# Patient Record
Sex: Male | Born: 1975 | Hispanic: Yes | Marital: Married | Smoking: Never smoker
Health system: Southern US, Community
[De-identification: ages and names within clinical notes are randomized; demographics above are authoritative.]

## PROBLEM LIST (undated history)

## (undated) DIAGNOSIS — M5416 Radiculopathy, lumbar region: Secondary | ICD-10-CM

## (undated) HISTORY — DX: Radiculopathy, lumbar region: M54.16

## (undated) HISTORY — PX: OTHER SURGICAL HISTORY: SHX169

---

## 2016-05-10 ENCOUNTER — Emergency Department (HOSPITAL_COMMUNITY)
Admission: EM | Admit: 2016-05-10 | Discharge: 2016-05-10 | Disposition: A | Discharge status: Home / Self Care | Payer: Self-pay | Attending: Emergency Medicine | Admitting: Emergency Medicine

## 2016-05-10 ENCOUNTER — Encounter (HOSPITAL_COMMUNITY): Payer: Self-pay | Admitting: Emergency Medicine

## 2016-05-10 ENCOUNTER — Emergency Department (HOSPITAL_COMMUNITY): Payer: Self-pay

## 2016-05-10 DIAGNOSIS — Y939 Activity, unspecified: Secondary | ICD-10-CM | POA: Insufficient documentation

## 2016-05-10 DIAGNOSIS — X58XXXA Exposure to other specified factors, initial encounter: Secondary | ICD-10-CM | POA: Insufficient documentation

## 2016-05-10 DIAGNOSIS — F17200 Nicotine dependence, unspecified, uncomplicated: Secondary | ICD-10-CM | POA: Insufficient documentation

## 2016-05-10 DIAGNOSIS — S39012A Strain of muscle, fascia and tendon of lower back, initial encounter: Secondary | ICD-10-CM | POA: Insufficient documentation

## 2016-05-10 DIAGNOSIS — Y929 Unspecified place or not applicable: Secondary | ICD-10-CM | POA: Insufficient documentation

## 2016-05-10 DIAGNOSIS — Y999 Unspecified external cause status: Secondary | ICD-10-CM | POA: Insufficient documentation

## 2016-05-10 MED ORDER — CYCLOBENZAPRINE HCL 10 MG PO TABS
10.0000 mg | ORAL_TABLET | Freq: Three times a day (TID) | ORAL | 0 refills | Status: DC | PRN
Start: 2016-05-10 — End: 2016-06-24

## 2016-05-10 MED ORDER — HYDROCODONE-ACETAMINOPHEN 5-325 MG PO TABS
1.0000 | ORAL_TABLET | Freq: Once | ORAL | Status: AC
  Administered 2016-05-10: 1 via ORAL
  Filled 2016-05-10: qty 1

## 2016-05-10 MED ORDER — NAPROXEN 500 MG PO TABS: 500.0000 mg | ORAL_TABLET | Freq: Two times a day (BID) | ORAL | 0 refills | Status: DC

## 2016-05-10 MED ORDER — METHYLPREDNISOLONE 4 MG PO TBPK: ORAL_TABLET | ORAL | 0 refills | Status: DC

## 2016-05-10 NOTE — ED Notes (Signed)
Patient transported to X-ray 

## 2016-05-10 NOTE — ED Provider Notes (Signed)
[facility] Provider Note   CSN: [id number] Arrival date & time: 05/10/16  0840  By signing my name below, I, Jason Hunt, attest that this documentation has been prepared under the direction and in the presence of Jason Loll, PA-C. Electronically Signed: Majel Hunt, Scribe. 05/10/2016. 9:46 AM.  History   Chief Complaint Chief Complaint  Patient presents with  . Back Pain   The history is provided by the patient. No language interpreter was used.   HPI Comments: Jason Hunt is a 41 y.o. male who presents to the Emergency Department complaining of gradually worsening, back pain that began 4 days ago and worsened this morning. Pt reports his pain is exacerbated when moving from a seated to standing position and when ambulating. However, he states he experiences "no pain" when lying in a supine position and sitting upright in a chair. He states hx of similar back pain ~4 years ago that resolved on its own after a few days. Pt notes he has applied IcyHot to his back with no relief. He denies difficulty urinating, urinary or bowel incontinence, fever, hx of cancer or IV drug use and saddle anaesthesia.   History reviewed. No pertinent past medical history.  There are no active problems to display for this patient.  History reviewed. No pertinent surgical history.  Home Medications    Prior to Admission medications   Not on File    Family History No family history on file.  Social History Social History  Substance Use Topics  . Smoking status: Current Some Day Smoker  . Smokeless tobacco: Not on file  . Alcohol use Yes     Allergies   Patient has no known allergies.   Review of Systems Review of Systems  Constitutional: Negative for fever.  Genitourinary: Negative for difficulty urinating.  Musculoskeletal: Positive for back pain.  Neurological: Negative for numbness.  All other systems reviewed and are negative.  Physical Exam Updated Vital  Signs BP 123/78 (BP Location: Right Arm)   Pulse 75   Temp 98.4 F (36.9 C)   Resp 17   Ht 5\' 5"  (1.651 m)   Wt 155 lb (70.3 kg)   SpO2 98%   BMI 25.79 kg/m   Physical Exam  Constitutional: He is oriented to person, place, and time. He appears well-developed and well-nourished.  HENT:  Head: Normocephalic.  Eyes: EOM are normal.  Neck: Normal range of motion. Neck supple.  No midline C-spine tenderness.  Cardiovascular: Intact distal pulses.   Pulmonary/Chest: Effort normal.  Abdominal: He exhibits no distension.  Musculoskeletal: Normal range of motion. He exhibits tenderness. He exhibits no deformity.  No T-spine midline tenderness. Tenderness over the midline L-spine. Mild bilateral paraspinal tenderness to the L-spine. Full range of motion. No deformities or step-offs noted. Negative straight leg raise test left and right. Strength 5 out of 5 in lower extremities. Reflexes 2+ at the patella. Cap refill normal. DP pulses are 2+ bilaterally. Patient is able to cannulate with normal gait.  Neurological: He is alert and oriented to person, place, and time. GCS eye subscore is 4. GCS verbal subscore is 5. GCS motor subscore is 6.  Reflex Scores:      Patellar reflexes are 2+ on the right side and 2+ on the left side. Skin: Skin is warm and dry. Capillary refill takes less than 2 seconds.  Psychiatric: He has a normal mood and affect.  Nursing note and vitals reviewed.  ED Treatments / Results  Labs (all  labs ordered are listed, but only abnormal results are displayed) Labs Reviewed - No data to display  EKG  EKG Interpretation None       Radiology Dg Lumbar Spine Complete  Result Date: 05/10/2016 CLINICAL DATA:  Worsening back pain which began 4 days ago. EXAM: LUMBAR SPINE - COMPLETE 4+ VIEW COMPARISON:  None. FINDINGS: There are 5 non rib-bearing lumbar type vertebrae. There is partial lumbarization of S1 with a rudimentary S1-2 disc. Vertebral alignment is normal.  Vertebral body heights are preserved without evidence of fracture. No pars defects are identified. Disc space heights are preserved without significant degenerative changes identified. No soft tissue abnormality is seen. IMPRESSION: Negative. Electronically Signed   By: Sebastian AcheAllen  Grady M.D.   On: 05/10/2016 11:03    Procedures Procedures (including critical care time)  Medications Ordered in ED Medications  HYDROcodone-acetaminophen (NORCO/VICODIN) 5-325 MG per tablet 1 tablet (1 tablet Oral Given 05/10/16 0959)    DIAGNOSTIC STUDIES:  Oxygen Saturation is 98% on RA, normal by my interpretation.    COORDINATION OF CARE:  9:46 AM Discussed treatment plan with pt at bedside and pt agreed to plan.  Initial Impression / Assessment and Plan / ED Course  I have reviewed the triage vital signs and the nursing notes.  Pertinent labs & imaging results that were available during my care of the patient were reviewed by me and considered in my medical decision making (see chart for details).  Clinical Course   Patient with back pain. Patient works in [employer] and this is likely a musculoskeletal lower back strain. No neurological deficits and normal neuro exam.  Patient is ambulatory with normal gait. Lumbar x-ray is unremarkable for any acute findings. No loss of bowel or bladder control.  No concern for cauda equina.  No fever, night sweats, weight loss, h/o cancer, IVDA, no recent procedure to back. No urinary symptoms suggestive of UTI.  Patient given prescription for muscle relaxers, naproxen, steroid Dosepak. Encourage back exercises and back support with working. Encouraged heat to lower back. Supportive care and return precaution discussed. Encouraged follow-up with primary care doctor if symptoms do not improve. Strict return precautions discussed including loss of bowel or bladder, urinary retention, saddle paresthesias, fever or for any other reason. Appears safe for discharge at this time.  Follow up as indicated in discharge paperwork.   I personally performed the services described in this documentation, which was scribed in my presence. The recorded information has been reviewed and is accurate.   Final Clinical Impressions(s) / ED Diagnoses   Final diagnoses:  Strain of lumbar region, initial encounter    New Prescriptions New Prescriptions   CYCLOBENZAPRINE (FLEXERIL) 10 MG TABLET    Take 1 tablet (10 mg total) by mouth 3 (three) times daily as needed for muscle spasms.   METHYLPREDNISOLONE (MEDROL DOSEPAK) 4 MG TBPK TABLET    Take as directed   NAPROXEN (NAPROSYN) 500 MG TABLET    Take 1 tablet (500 mg total) by mouth 2 (two) times daily.     Rise MuKenneth T Leaphart, PA-C 05/10/16 1123    Lavera Guiseana Duo Liu, MD 05/10/16 [phone]

## 2016-05-10 NOTE — ED Notes (Signed)
Lower back  X 4 days , works [employer] hurts  To walk,

## 2016-05-10 NOTE — ED Triage Notes (Signed)
Pt states "i've had pain in my back for a week," pt pointing to center of back. Pt states when he stands up it hurts worse. Pt works [employer].

## 2016-05-10 NOTE — Discharge Instructions (Signed)
Your x-ray does not show any fractures of your bone. This is likely a musculoskeletal sprain. You need to wear a back brace while working. I have also given you back exercises to use. Take the Flexeril which is a muscle relaxer. This will make you drowsy so do not drive with that or operate heavy machinery. Take the Medrol Dosepak as prescribed by the pharmacist. Take the naproxen 2 times a day for no more than 5 days. He may also take Tylenol. Avoid any other NSAIDs including Motrin or Advil. Follow up with your primary care doctor. Return to the ED if you develop any worsening symptoms including losing control of her bowel or bladder, or unable to urinate, numbness in her groin, numbness in her legs.

## 2016-05-10 NOTE — ED Notes (Signed)
Pt returned from xray

## 2016-05-15 ENCOUNTER — Emergency Department (HOSPITAL_COMMUNITY)
Admission: EM | Admit: 2016-05-15 | Discharge: 2016-05-15 | Disposition: A | Discharge status: Home / Self Care | Payer: Self-pay | Attending: Emergency Medicine | Admitting: Emergency Medicine

## 2016-05-15 ENCOUNTER — Encounter (HOSPITAL_COMMUNITY): Payer: Self-pay | Admitting: Emergency Medicine

## 2016-05-15 DIAGNOSIS — Z79899 Other long term (current) drug therapy: Secondary | ICD-10-CM | POA: Insufficient documentation

## 2016-05-15 DIAGNOSIS — M545 Low back pain, unspecified: Secondary | ICD-10-CM

## 2016-05-15 DIAGNOSIS — F17200 Nicotine dependence, unspecified, uncomplicated: Secondary | ICD-10-CM | POA: Insufficient documentation

## 2016-05-15 MED ORDER — TRAMADOL HCL 50 MG PO TABS: 50.0000 mg | ORAL_TABLET | Freq: Four times a day (QID) | ORAL | 0 refills | Status: DC | PRN

## 2016-05-15 NOTE — ED Notes (Signed)
Pt stable, understands discharge instructions, and reasons for return.   

## 2016-05-15 NOTE — ED Provider Notes (Signed)
[facility] Provider Note   CSN: [id number] Arrival date & time: 05/15/16  1857   History   Chief Complaint Chief Complaint  Patient presents with  . Back Pain    HPI Denyce RobertRene Chavez Dubon is a 41 y.o. male.  HPI   41 year old male presents today with complaints of back pain. Patient reports that he was seen on 05/10/2016 for similar presentation. He notes at that time he was having bilateral lower back pain with no radiation of symptoms, no complications. He was given muscle relaxers, steroids, and naproxen. Patient notes that symptoms improved slightly, but again worsened when he tried to go back to work. Patient notes the pain is bilateral lower lumbar region, with no radiation of symptoms. He denies any loss of distal sensation strength or motor function. He denies any abdominal pain. Patient denies any other red flags for back pain. Patient reports that while laying down he feels very little pain, pain is worsened by standing up moving and bending forward. No history trauma, but patient notes he works in [employer].  History reviewed. No pertinent past medical history.  There are no active problems to display for this patient.   History reviewed. No pertinent surgical history.     Home Medications    Prior to Admission medications   Medication Sig Start Date End Date Taking? Authorizing Provider  cyclobenzaprine (FLEXERIL) 10 MG tablet Take 1 tablet (10 mg total) by mouth 3 (three) times daily as needed for muscle spasms. 05/10/16   Rise MuKenneth T Leaphart, PA-C  methylPREDNISolone (MEDROL DOSEPAK) 4 MG TBPK tablet Take as directed 05/10/16   Rise MuKenneth T Leaphart, PA-C  naproxen (NAPROSYN) 500 MG tablet Take 1 tablet (500 mg total) by mouth 2 (two) times daily. 05/10/16   Rise MuKenneth T Leaphart, PA-C  traMADol (ULTRAM) 50 MG tablet Take 1 tablet (50 mg total) by mouth every 6 (six) hours as needed. 05/15/16   Eyvonne MechanicJeffrey Kacee Koren, PA-C    Family History No family history on  file.  Social History Social History  Substance Use Topics  . Smoking status: Current Some Day Smoker  . Smokeless tobacco: Never Used  . Alcohol use Yes     Allergies   Patient has no known allergies.   Review of Systems Review of Systems  All other systems reviewed and are negative.    Physical Exam Updated Vital Signs BP 145/79 (BP Location: Left Arm)   Pulse 72   Temp 98.3 F (36.8 C) (Oral)   Resp 20   SpO2 97%     Physical Exam  Constitutional: He is oriented to person, place, and time. He appears well-developed and well-nourished. No distress.  HENT:  Head: Normocephalic and atraumatic.  Eyes: Conjunctivae are normal. Pupils are equal, round, and reactive to light. Right eye exhibits no discharge. Left eye exhibits no discharge. No scleral icterus.  Neck: Normal range of motion. Neck supple. No JVD present. No tracheal deviation present.  Pulmonary/Chest: Effort normal. No stridor.  Musculoskeletal: Normal range of motion. He exhibits no edema or tenderness.  No C, T, or L spine tenderness to palpation. No obvious signs of trauma, deformity, infection, step-offs. Lung expansion normal. No scoliosis or kyphosis. Bilateral lower extremity strength 5 out of 5, sensation grossly intact Very minor tenderness to palpation of the bilateral lower lumbar soft tissue.  Straight leg negative Ambulates without difficulty   Neurological: He is alert and oriented to person, place, and time. Coordination normal.  Skin: Skin is warm and dry. He  is not diaphoretic.  Psychiatric: He has a normal mood and affect. His behavior is normal. Judgment and thought content normal.  Nursing note and vitals reviewed.    ED Treatments / Results  Labs (all labs ordered are listed, but only abnormal results are displayed) Labs Reviewed - No data to display  EKG  EKG Interpretation None       Radiology No results found.  Procedures Procedures (including critical care  time)  Medications Ordered in ED Medications - No data to display   Initial Impression / Assessment and Plan / ED Course  I have reviewed the triage vital signs and the nursing notes.  Pertinent labs & imaging results that were available during my care of the patient were reviewed by me and considered in my medical decision making (see chart for details).  Clinical Course      Final Clinical Impressions(s) / ED Diagnoses   Final diagnoses:  Acute low back pain without sciatica, unspecified back pain laterality    Labs:  Imaging:  Consults:  Therapeutics:  Discharge Meds: ultram  Assessment/Plan:  41 year old male presents today with a complaint of back pain. Patient has been seen in the emergency room for this before, reports improvement in symptoms but had a relapse after trying to collect work. Patient will be given a short course of pain medication, encouraged to rest, back exercises, follow-up with orthopedic specialist. Patient given strict return caution's, he verbalized understanding and agreement to today's plan had no further questions or concerns.  Patient's son assisted in translation at patient's request.   New Prescriptions Discharge Medication List as of 05/15/2016  9:08 PM    START taking these medications   Details  traMADol (ULTRAM) 50 MG tablet Take 1 tablet (50 mg total) by mouth every 6 (six) hours as needed., Starting Sun 05/15/2016, Print         Eyvonne Mechanic, PA-C 05/15/16 8469    Arby Barrette, MD 05/17/16 6295

## 2016-05-15 NOTE — ED Triage Notes (Signed)
Pt returns for c/o continues to have back pain. Pt seen here on the 9th for  Same. Pt denies injury.

## 2016-06-24 ENCOUNTER — Emergency Department (HOSPITAL_COMMUNITY)
Admission: EM | Admit: 2016-06-24 | Discharge: 2016-06-24 | Disposition: A | Discharge status: Home / Self Care | Payer: Self-pay | Attending: Emergency Medicine | Admitting: Emergency Medicine

## 2016-06-24 ENCOUNTER — Encounter (HOSPITAL_COMMUNITY): Payer: Self-pay

## 2016-06-24 DIAGNOSIS — M5126 Other intervertebral disc displacement, lumbar region: Secondary | ICD-10-CM

## 2016-06-24 DIAGNOSIS — Z79899 Other long term (current) drug therapy: Secondary | ICD-10-CM | POA: Insufficient documentation

## 2016-06-24 DIAGNOSIS — K458 Other specified abdominal hernia without obstruction or gangrene: Secondary | ICD-10-CM | POA: Insufficient documentation

## 2016-06-24 MED ORDER — PREDNISONE 10 MG (21) PO TBPK: ORAL_TABLET | Freq: Every day | ORAL | 0 refills | Status: DC

## 2016-06-24 NOTE — Discharge Instructions (Signed)
Take steroid pack along with your other medicines Call Monday for appointment with Dr. Penni BombardKendall this week

## 2016-06-24 NOTE — ED Notes (Signed)
Pt states he was sent here to see if there was a neurosurgeon to perform surgery on his lumbar hernia. [facility] and had a MRI. Pt states he came here because insurance is refusing to pay.

## 2016-06-24 NOTE — ED Notes (Signed)
Pt able to ambulate without difficulty.

## 2016-06-24 NOTE — ED Triage Notes (Addendum)
Per Pt through translator, Pt is coming from home with complaints of back pain. Pt was referred to Orthopedist. The patient was known to have a slipped disk. Pt does not have insurance and was not able to pay. The pt reports that his work is not willing to pay even though it is a work related problem. He is currently battling with insurance. Pt was sent here because it would be cheaper to have surgery, and it could be done by a neuro surgeon. Pt has had a MRI and stated he could get the results per request. Medication was given and pt has been able to manage the pain, but is requesting surgery for long term. Pt denies any change in urination or bowel movements. Denies any numbness or tingling in legs. Orthopedist stated without surgery the pain would continue into his legs.

## 2016-06-24 NOTE — ED Provider Notes (Signed)
[facility] Provider Note   CSN: [id number] Arrival date & time: 06/24/16  [id number]   By signing my name below, I, Clovis PuAvnee Patel, attest that this documentation has been prepared under the direction and in the presence of  Terance HartKelly Gaberiel Youngblood, PA-C. Electronically Signed: Clovis PuAvnee Patel, ED Scribe. 06/24/16. 5:55 PM.   History   Chief Complaint Chief Complaint  Patient presents with  . Back Pain   The history is provided by the patient. A language interpreter was used.   HPI Comments:  Jason Hunt is a 41 y.o. male who presents to the Emergency Department complaining of persistent back pain which radiates to his sides and bilateral legs onset several weeks ago. Pt states he was seen by his orthopedist, [facility], had an MRI of his back, was diagnosed with a "lumbar hernia". He states he was told by a receptionist today that he needed to visit the ED to see a neurosurgeon and have surgery. Pt states his place of work is not willing to pay for his medical bills and notes he does not have insurance so he thinks this is why he was told that.  Per chart review, pt has been seen twice in the ED for similar presentation. On 05/10/16 pt was diagnosed with a strain of the lumbar region and was prescribed flexeril and naproxen. On 05/15/16 pt was prescribed tramadol for his back pain. Pt notes these medications are no longer providing relief. He denies any other associated symptoms at this time. No other complaints noted. No leg weakness, bowel/bladder incontinence.  Orthopedist: Dr. Rodolph BongAdam Kendall    History reviewed. No pertinent past medical history.  There are no active problems to display for this patient.   History reviewed. No pertinent surgical history.   Home Medications    Prior to Admission medications   Medication Sig Start Date End Date Taking? Authorizing Provider  cyclobenzaprine (FLEXERIL) 10 MG tablet Take 1 tablet (10 mg total) by mouth 3 (three)  times daily as needed for muscle spasms. 05/10/16   Rise MuKenneth T Leaphart, PA-C  methylPREDNISolone (MEDROL DOSEPAK) 4 MG TBPK tablet Take as directed 05/10/16   Rise MuKenneth T Leaphart, PA-C  naproxen (NAPROSYN) 500 MG tablet Take 1 tablet (500 mg total) by mouth 2 (two) times daily. 05/10/16   Rise MuKenneth T Leaphart, PA-C  traMADol (ULTRAM) 50 MG tablet Take 1 tablet (50 mg total) by mouth every 6 (six) hours as needed. 05/15/16   Eyvonne MechanicJeffrey Hedges, PA-C    Family History No family history on file.  Social History Social History  Substance Use Topics  . Smoking status: Never Smoker  . Smokeless tobacco: Never Used  . Alcohol use Yes     Allergies   Patient has no known allergies.   Review of Systems Review of Systems  Musculoskeletal: Positive for back pain and myalgias.  Neurological: Negative for numbness.   Physical Exam Updated Vital Signs BP 150/93 (BP Location: Left Arm)   Pulse 78   Temp 98.9 F (37.2 C) (Oral)   Resp 18   Ht 5\' 7"  (1.702 m)   Wt 170 lb (77.1 kg)   SpO2 98%   BMI 26.63 kg/m   Physical Exam  Constitutional: He is oriented to person, place, and time. He appears well-developed and well-nourished. No distress.  HENT:  Head: Normocephalic and atraumatic.  Eyes: Conjunctivae are normal.  Cardiovascular: Normal rate.   Pulmonary/Chest: Effort normal.  Abdominal: He exhibits no distension.  Musculoskeletal: He exhibits  tenderness.  Inspection: No masses, deformity, or rash Palpation: Midline lumbar spinal tenderness. No paraspinal muscle tenderness. Strength: 5/5 in lower extremities and normal plantar and dorsiflexion Sensation: Intact sensation with light touch in lower extremities bilaterally Reflexes: Patellar reflex is 2+ bilaterally Gait: Normal gait  Neurological: He is alert and oriented to person, place, and time.  Skin: Skin is warm and dry.  Psychiatric: He has a normal mood and affect.  Nursing note and vitals reviewed.    ED Treatments / Results    DIAGNOSTIC STUDIES:  Oxygen Saturation is 98% on RA, normal by my interpretation.    COORDINATION OF CARE:  5:52 PM Discussed treatment plan with pt at bedside and pt agreed to plan.  Labs (all labs ordered are listed, but only abnormal results are displayed) Labs Reviewed - No data to display  EKG  EKG Interpretation None       Radiology No results found.  Procedures Procedures (including critical care time)  Medications Ordered in ED Medications - No data to display   Initial Impression / Assessment and Plan / ED Course  I have reviewed the triage vital signs and the nursing notes.  Pertinent labs & imaging results that were available during my care of the patient were reviewed by me and considered in my medical decision making (see chart for details).  41 year old male presents with low back pain. He has good strength in his legs and is ambulatory. No bowel/bladder incontinence. Has known herniated disc. Unsure of why he was told to come have surgery here but likely the language barrier has caused some confusion. Spoke with Dr. Charlann Boxer [facility]. He states to start patient on Prednisone dose pack and have patient call the office Monday for follow up with Penni Bombard this week. Discussed with patient at length about plan. He understands he needs to take steroids and follow up with Dr. Markus Jarvis office for another appointment.   Final Clinical Impressions(s) / ED Diagnoses   Final diagnoses:  Lumbar herniated disc    New Prescriptions Discharge Medication List as of 06/24/2016  7:54 PM    START taking these medications   Details  predniSONE (STERAPRED UNI-PAK 21 TAB) 10 MG (21) TBPK tablet Take by mouth daily. Take 6 tabs by mouth daily  for 2 days, then 5 tabs for 2 days, then 4 tabs for 2 days, then 3 tabs for 2 days, 2 tabs for 2 days, then 1 tab by mouth daily for 2 days, Starting Fri 06/24/2016, Print       I personally performed the services  described in this documentation, which was scribed in my presence. The recorded information has been reviewed and is accurate.     Bethel Born, PA-C 06/24/16 2039    Alvira Monday, MD 06/25/16 1236

## 2016-07-26 ENCOUNTER — Ambulatory Visit: Payer: Self-pay | Admitting: Physical Therapy

## 2016-08-24 ENCOUNTER — Ambulatory Visit (INDEPENDENT_AMBULATORY_CARE_PROVIDER_SITE_OTHER): Payer: Self-pay | Admitting: Physician Assistant

## 2016-08-26 ENCOUNTER — Ambulatory Visit: Payer: Self-pay | Attending: Family Medicine | Admitting: Family Medicine

## 2016-08-26 ENCOUNTER — Encounter: Payer: Self-pay | Admitting: Family Medicine

## 2016-08-26 VITALS — BP 129/79 | HR 94 | Temp 98.3°F | Resp 18 | Ht 67.0 in | Wt 169.2 lb

## 2016-08-26 DIAGNOSIS — M5126 Other intervertebral disc displacement, lumbar region: Secondary | ICD-10-CM | POA: Insufficient documentation

## 2016-08-26 DIAGNOSIS — G8929 Other chronic pain: Secondary | ICD-10-CM | POA: Insufficient documentation

## 2016-08-26 DIAGNOSIS — Z79899 Other long term (current) drug therapy: Secondary | ICD-10-CM | POA: Insufficient documentation

## 2016-08-26 MED ORDER — GABAPENTIN 300 MG PO CAPS: 300.0000 mg | ORAL_CAPSULE | Freq: Every evening | ORAL | 0 refills | Status: DC | PRN

## 2016-08-26 MED ORDER — DICLOFENAC SODIUM 75 MG PO TBEC: 75.0000 mg | DELAYED_RELEASE_TABLET | Freq: Two times a day (BID) | ORAL | 0 refills | Status: DC

## 2016-08-26 MED ORDER — METHOCARBAMOL 500 MG PO TABS: 500.0000 mg | ORAL_TABLET | Freq: Three times a day (TID) | ORAL | 0 refills | Status: DC | PRN

## 2016-08-26 NOTE — Patient Instructions (Signed)
Dolor de espalda en adultos (Back Pain, Adult) El dolor de espalda es muy frecuente en los adultos.La causa del dolor de espalda es rara vez peligrosa y Conservation officer, historic buildings a menudo mejora con el [place].Es posible que se desconozca la causa de esta afeccin. Algunas causas comunes son las siguientes:  Distensin de los msculos o ligamentos que sostienen la columna vertebral.  Holiday representative (degeneracin) de los discos vertebrales.  Artritis.  Lesiones directas en la espalda. En [employer], el dolor de espalda es recurrente. Como rara vez es peligroso, las personas pueden aprender a Holiday representative afeccin por s mismas. INSTRUCCIONES PARA EL CUIDADO EN EL HOGAR Controle su dolor de espalda a fin de Recruitment consultant cambio. Las siguientes indicaciones ayudarn a Chief Strategy Officer que pueda sentir:  IT consultant. Si permanece sentado o de pie en un mismo lugar durante mucho tiempo, se tensiona la espalda. No se siente, conduzca o permanezca de pie en un mismo lugar durante ms de 30 minutos seguidos. Realice caminatas cortas en superficies planas tan pronto como le sea posible.Trate de caminar un poco ms de Publishing copy.  Haga ejercicio regularmente como se lo haya indicado el mdico. El ejercicio ayuda a que su espalda se cure ms rpidamente. Tambin ayuda a prevenir futuras lesiones al [employer] fuertes y flexibles.  No permanezca en la cama.Si hace reposo ms de 1 a 2 das, puede demorar su recuperacin.  Preste atencin a su cuerpo al inclinarse y levantarse. Las posiciones ms cmodas son las que ejercen menos tensin en la espalda en recuperacin. Siempre use tcnicas apropiadas para levantar objetos, como por ejemplo:  Flexionar las rodillas.  Mantener la carga cerca del cuerpo.  No torcerse.  Encuentre una posicin cmoda para dormir. Use un colchn firme y recustese de costado con las rodillas ligeramente flexionadas. Si se recuesta [employer],  coloque una almohada debajo de las rodillas.  Evite sentir ansiedad o estrs.El estrs aumenta la tensin muscular y puede empeorar el dolor de espalda.Es importante reconocer si se siente ansioso o estresado y aprender maneras de controlarlo, por ejemplo haciendo ejercicio.  Tome los medicamentos solamente como se lo haya indicado el mdico. Los medicamentos de venta libre para Best boy y la inflamacin a menudo son los ms eficaces.El mdico puede recetarle relajantes musculares.Estos medicamentos ayudan a Glass blower/designer de modo que pueda reanudar ms rpidamente sus actividades normales y el ejercicio saludable.  Aplique hielo sobre la zona lesionada.  Ponga el hielo en una bolsa plstica.  Coloque una toalla entre la piel y la bolsa de hielo.  Deje el hielo durante 96mnutos, 2 a 3veces por da, durante los primeros 2 o 3das. Despus de eso, puede alternar el hielo y el calor para reducir eConservation officer, historic buildingsy los espasmos.  Mantenga un peso saludable. El exceso de peso ejerce presin adicional sobre la espalda y hace que resulte difcil mantener una buena pTwain SOLICITE ATENCIN MDICA SI:  Siente un dolor que no se alivia con reposo o medicamentos.  Siente mucho dolor que se extiende a las piernas o los glteos.  El dolor no mejora en una semana.  Siente dolor por la noche.  Pierde peso.  Siente escalofros o fiebre. SOLICITE ATENCIN MDICA DE INMEDIATO SI:  Tiene nuevos problemas para controlar la vejiga o los intestinos.  Siente debilidad o adormecimiento inusuales en los brazos o en las piernas.  Siente nuseas o vmitos.  Siente dolor abdominal.  Siente que va a desmayarse.  Esta informacin no tiene Marine scientist el consejo del mdico. Asegrese de hacerle al mdico cualquier pregunta que tenga. Document Released: 04/18/2005 Document Revised: 05/09/2014 Document Reviewed: 08/20/2013 Elsevier Interactive Patient Education  2017 [place] tablets Qu es este medicamento? El METOCARBAMOL ayuda a Best boy y la rigidez muscular causado por esfuerzos, esguinces y Scientist, research (medical) lesiones de los msculos. Este medicamento puede ser utilizado para otros usos; si tiene alguna pregunta consulte con su proveedor de atencin mdica o con su farmacutico. MARCAS COMUNES: Robaxin Qu le debo informar a mi profesional de la salud antes de tomar este medicamento? Necesita saber si usted presenta alguno de los siguientes problemas o situaciones: -enfermedad renal -convulsiones -una reaccin alrgica o inusual al metocarbamol, a otros medicamentos, alimentos, colorantes o conservantes -si est embarazada o buscando quedar embarazada -si est amamantando a un beb Cmo debo utilizar este medicamento? Tome este medicamento por va oral con un vaso lleno de agua. Siga las instrucciones de la etiqueta del [place]. Tome sus dosis a intervalos regulares. No tome su medicamento con una frecuencia mayor a la indicada. Hable con su pediatra para informarse acerca del uso de este medicamento en nios. Puede requerir atencin especial. Sobredosis: Pngase en contacto inmediatamente con un centro toxicolgico o una sala de urgencia si usted cree que haya tomado demasiado medicamento. ATENCIN: [employer] es solo para usted. No comparta este medicamento con nadie. Qu sucede si me olvido de una dosis? Si olvida una dosis, tmela lo antes posible. Si es casi la hora de la prxima dosis, tome slo esa dosis. No tome dosis adicionales o dobles. Qu puede interactuar con este medicamento? No tome esta medicina con ninguno de los siguientes medicamentos: medicamentos narcticos para la tos Esta medicina tambin puede interactuar con los siguientes medicamentos: alcohol antihistamnicos para Buyer, retail, tos y resfro ciertos medicamentos para la ansiedad o para conciliar el sueo ciertos medicamentos para la depresin, como  amitriptilina, fluoxetina, sertralina ciertos medicamentos para convulsiones, tales como fenobarbital, primidona inhibidores de colinesterasa, tales como neostigmina, ambenonium y bromuro de piridostigmina anestsicos generales, tales como halotano, isoflurano, metoxiflurano, propofol anestsicos locales, tales como lidocana, pramoxina, tetracana medicamentos para relajar los msculos antes de una ciruga medicamentos narcticos para Conservation officer, historic buildings fenotiazinas, tales como clorpromazina, Musician, Government social research officer, tioridazina Puede ser que esta lista no menciona todas las posibles interacciones. Informe a su profesional de [employer]	[place] de [employer] productos a base de hierbas, medicamentos de [place] o suplementos nutritivos que est tomando. Si usted fuma, consume bebidas alcohlicas o si utiliza drogas ilegales, indqueselo tambin a su profesional de KB Home	[place]. Algunas sustancias pueden interactuar con su medicamento. A qu debo estar atento al usar Coca[employer]a? Informe a su mdico o a su profesional de la salud si sus sntomas no comienzan a mejorar o si empeoran. Puede experimentar somnolencia o mareos. No conduzca, no utilice maquinaria ni haga nada que Associate Professor en estado de alerta hasta que sepa cmo le afecta este medicamento. No se siente ni se ponga de pie con rapidez, especialmente si es un paciente de edad avanzada. Esto reduce el riesgo de mareos o [employer]. El alcohol puede interferir con el efecto de este medicamento. Evite consumir bebidas alcohlicas. Si est tomando otro medicamento que tambin causa somnolencia, es posible que tenga ms efectos secundarios. Entrguele a su proveedor de atencin mdica una lista de todos los medicamentos que [place]. Su mdico le dir cunto Dentist. No tome ms medicamento que lo indicado. Llame al servicio de  emergencias para recibir ayuda si tiene problemas para respirar o somnolencia inusual. Qu efectos secundarios puedo tener al  [employer] este medicamento? Efectos secundarios que debe informar a su mdico o a Barrister's clerk de la salud tan pronto como sea posible: Chief of Staff, como erupcin cutnea, comezn/picazn o urticarias, hinchazn de la cara, los labios o la lengua problemas respiratorios confusin convulsiones cansancio o debilidad inusual Efectos secundarios que generalmente no requieren atencin mdica (debe informarlos a su mdico o a Barrister's clerk de la salud si persisten o si son molestos): Teacher, English as a foreign language de cabeza sabor metlico cansancio Higher education careers adviser Puede ser que esta lista no menciona todos los posibles efectos secundarios. Comunquese a su mdico por asesoramiento mdico [employer]. Usted puede informar los efectos secundarios a la FDA por telfono al 1-800-FDA-1088. Dnde debo guardar mi medicina? Mantngala fuera del alcance de los nios. Gurdela a una temperatura, entre 20 y 1 grados C (18 y 52 grados F). Mantenga el envase bien cerrado. Deseche los medicamentos que no haya utilizado, despus de la fecha de vencimiento. ATENCIN: Este folleto es un resumen. Puede ser que no cubra toda la posible informacin. Si usted tiene preguntas acerca de esta medicina, consulte con su mdico, su farmacutico o su profesional de Technical sales engineer.  2018 Elsevier/Gold Standard (2015-03-17 00:00:00)

## 2016-08-26 NOTE — Progress Notes (Signed)
Patient is here for back pain  Patient stated that he been to the orthopedic of [place] & they diagnosed him Hernia in the disc   Patient stated that with the medication that they gave him it eased the pain the pain they also told him that he might be needing surgery   So patient wants to know even tough he does not have pain no more does he still has to have the surgery

## 2016-08-26 NOTE — Progress Notes (Signed)
Subjective:  Patient ID: Jason Hunt, male    DOB: 06/12/1975  Age: 41 y.o. MRN: [id number]  CC: Establish Care   HPI Jason Hunt presents for   Chronic back pain:  Patient complains of chronic low back pain. This is evaluated as a personal injury. The patient first noted symptoms 4months ago. It was related to lifting a heavy object at work. The pain is rated moderate to severe, and is located at the left lumbar area. The pain is described as aching and occurs all day. The symptoms denies been progressive. Symptoms are exacerbated by flexion. Factors which relieve the pain include muscle relaxants and NSAIDs. Other associated symptoms include tingling in the left leg and numbness. Previous history of symptoms: never. Denies any bowel or bladder incontinence. Treatment efforts have included rest, prescription NSAIDS, oral steroids and muscle relaxers, with  moderate relief of symptoms. Reports being seen followed by orthopedic specialist at [facility] who recommended surgical intervention.    Outpatient Medications Prior to Visit  Medication Sig Dispense Refill  . predniSONE (STERAPRED UNI-PAK 21 TAB) 10 MG (21) TBPK tablet Take by mouth daily. Take 6 tabs by mouth daily  for 2 days, then 5 tabs for 2 days, then 4 tabs for 2 days, then 3 tabs for 2 days, 2 tabs for 2 days, then 1 tab by mouth daily for 2 days 42 tablet 0  . diclofenac (VOLTAREN) 75 MG EC tablet Take 75 mg by mouth 2 (two) times daily.  0  . gabapentin (NEURONTIN) 300 MG capsule Take 300 mg by mouth at bedtime.     No facility-administered medications prior to visit.     ROS Review of Systems  Constitutional: Negative.   Respiratory: Negative.   Cardiovascular: Negative.   Gastrointestinal: Negative.   Musculoskeletal: Positive for back pain.  Skin: Negative.   Neurological:       Left leg parathesias     Objective:  BP 129/79 (BP Location: Left Arm, Patient Position: Sitting, Cuff Size:  Normal)   Pulse 94   Temp 98.3 F (36.8 C) (Oral)   Resp 18   Ht  (1.702 m)   Wt 169 lb 3.2 oz (76.7 kg)   SpO2 97%   BMI 26.50 kg/m   BP/Weight 08/26/2016 06/24/2016 05/15/2016  Systolic BP 129 126 145  Diastolic BP 79 89 79  Wt. (Lbs) 169.2 170 -  BMI 26.5 26.63 -   Physical Exam  Constitutional: He appears well-developed and well-nourished.  Cardiovascular: Normal rate, regular rhythm, normal heart sounds and intact distal pulses.   Pulmonary/Chest: Effort normal and breath sounds normal.  Abdominal: Soft. Bowel sounds are normal.  Musculoskeletal:       Lumbar back: He exhibits pain.  Positive straight leg test.  Skin: Skin is warm and dry.  Psychiatric: He has a normal mood and affect.  Nursing note and vitals reviewed.  Assessment & Plan:   Problem List Items Addressed This Visit    None    Visit Diagnoses    Lumbar herniated disc    -  Primary   Relevant Medications   diclofenac (VOLTAREN) 75 MG EC tablet   gabapentin (NEURONTIN) 300 MG capsule   methocarbamol (ROBAXIN) 500 MG tablet   Other Relevant Orders   Ambulatory referral to Orthopedics      Meds ordered this encounter  Medications  . diclofenac (VOLTAREN) 75 MG EC tablet    Sig: Take 1 tablet (75 mg total) by mouth  2 (two) times daily. Take with food.    Dispense:  40 tablet    Refill:  0    Order Specific Question:   Supervising Provider    Answer:   Quentin Angst [id number]  . gabapentin (NEURONTIN) 300 MG capsule    Sig: Take 1 capsule (300 mg total) by mouth at bedtime as needed.    Dispense:  30 capsule    Refill:  0    Order Specific Question:   Supervising Provider    Answer:   Quentin Angst [id number]  . methocarbamol (ROBAXIN) 500 MG tablet    Sig: Take 1 tablet (500 mg total) by mouth every 8 (eight) hours as needed for muscle spasms.    Dispense:  30 tablet    Refill:  0    Order Specific Question:   Supervising Provider    Answer:   Quentin Angst [id number]     Follow-up: Return if symptoms worsen or fail to improve.   Lizbeth Bark FNP

## 2016-08-31 MED FILL — METHOCARBAMOL 500 MG TABLET: 500 | 10 days supply | Qty: 30 | Fill #0

## 2016-08-31 MED FILL — GABAPENTIN 300 MG CAPSULE: 300 | 30 days supply | Qty: 30 | Fill #0

## 2016-09-09 ENCOUNTER — Ambulatory Visit: Payer: Self-pay | Attending: Family Medicine

## 2016-12-08 ENCOUNTER — Encounter (INDEPENDENT_AMBULATORY_CARE_PROVIDER_SITE_OTHER): Payer: Self-pay | Admitting: Specialist

## 2016-12-08 ENCOUNTER — Ambulatory Visit (INDEPENDENT_AMBULATORY_CARE_PROVIDER_SITE_OTHER): Payer: Self-pay | Admitting: Specialist

## 2016-12-08 DIAGNOSIS — M5116 Intervertebral disc disorders with radiculopathy, lumbar region: Secondary | ICD-10-CM

## 2016-12-08 DIAGNOSIS — G8929 Other chronic pain: Secondary | ICD-10-CM

## 2016-12-08 DIAGNOSIS — M5442 Lumbago with sciatica, left side: Secondary | ICD-10-CM

## 2016-12-08 NOTE — Progress Notes (Signed)
Office Visit Note   Patient: Jason Hunt           Date of Birth: 1975/12/16           MRN: [id number] Visit Date: 12/08/2016              Requested by: Jason Bark, FNP [address] [place], [place] [address] PCP: Jason Bark, FNP   Assessment & Plan: Visit Diagnoses:  1. Chronic left-sided low back pain with left-sided sciatica   2. Intervertebral disc disorders with radiculopathy, lumbar region     Plan: Since patient is currently doing better I recommend going to formal physical therapy for few weeks to work on strengthening, work conditioning, and they will also instruct home exercise program. Follow-up in 3 weeks for recheck and at that point I will see if he is ready to return back to work doing regular duty. He can continue current work restrictions of 3 days per week that was given by his primary care. All questions answered. Patient voices understanding of treatment plan given today.  Follow-Up Instructions: Return in about 3 weeks (around 12/29/2016) for Reevaluation lumbar spine issues.   Orders:  Orders Placed This Encounter  Procedures  . Ambulatory referral to Physical Therapy   No orders of the defined types were placed in this encounter.     Procedures: No procedures performed   Clinical Data: No additional findings.   Subjective: No chief complaint on file.   HPI 41 year old Hispanic male who is new patient to the office is being evaluated for his L5-S1 left HNP. Jason Hunt our office assistant is helped with interpreting. 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 No specific injury that he can recall. He originally thought that this was worker's comp but this was denied. Since onset of symptoms he's had 3 visits to the emergency room which were 05/10/2016, 05/15/2016 and 06/24/2016. He is also seeing Dr. Rodolph [facility] who help to manage his symptoms conservatively. Patient has had prednisone taper, oral NSAIDs, Neurontin and Robaxin. More spine MRI ordered by Dr. Penni Hunt and performed 06/02/2016 and report read L5-S1 moderate size large paracentral to posterior lateral superior disc extrusion measures approximately 12 mm in cranial caudal extent, with displacement of the traversing left S1 nerve root. Underlying mild disc bulge and minimal endplate spurring. Transitional lumbosacral anatomy, with a partially lumbarized "S1" vertebra level and prominent S1-S2 disc space. Currently patient states that he is doing well. Not having any pain in his back or left leg. Intermittent numbness and tingling around the left buttock. No leg weakness. No bowel or bladder incontinence. He is currently working 3 days per week and has been doing this since February. States that he is scared work more days because he is fearful that the severe symptoms that he had at onset that lasted a couple months would return. Patient is motivated to return back to work. Dr. Penni Hunt had previous recommended doing physical therapy but there are some issues with cost. Review of Systems No current cardiac pulmonary GI GU issues.  Objective: Vital Signs: There were no vitals taken for this visit.  Physical Exam  Constitutional: He is oriented to person, place, and time. He appears well-developed and well-nourished.  HENT:  Head: Normocephalic and atraumatic.  Eyes: Pupils are equal, round, and reactive to light. EOM  are normal.  Neck: Normal range of motion.  Pulmonary/Chest: No respiratory distress.  Musculoskeletal:  Gait is normal. Patient has full lumbar flexion with hands ankles without pain. Good lumbar extension without pain. No lumbar paraspinal tennis. Bilateral sciatic notch nontender. Can heel and toe gait without difficulty. Negative logroll bilateral hips. Negative straight leg raise. 2+ bilateral knee and ankle jerk.  Bilateral calves nontender. Neurovascularly intact. No focal motor deficits.  Neurological: He is alert and oriented to person, place, and time.  Skin: Skin is warm and dry.  Psychiatric: He has a normal mood and affect.    Ortho Exam  Specialty Comments:  No specialty comments available.  Imaging: No results found.   PMFS History: There are no active problems to display for this patient.  No past medical history on file.  No family history on file.  No past surgical history on file. Social History   Occupational History  . Not on file.   Social History Main Topics  . Smoking status: Never Smoker  . Smokeless tobacco: Never Used  . Alcohol use Yes  . Drug use: No  . Sexual activity: Not on file

## 2016-12-09 ENCOUNTER — Encounter: Payer: Self-pay | Admitting: Family Medicine

## 2016-12-09 ENCOUNTER — Ambulatory Visit: Payer: Self-pay | Attending: Family Medicine | Admitting: Family Medicine

## 2016-12-09 VITALS — BP 128/75 | HR 67 | Temp 98.7°F | Resp 18 | Ht 67.0 in | Wt 166.0 lb

## 2016-12-09 DIAGNOSIS — K209 Esophagitis, unspecified without bleeding: Secondary | ICD-10-CM

## 2016-12-09 DIAGNOSIS — Z0000 Encounter for general adult medical examination without abnormal findings: Secondary | ICD-10-CM

## 2016-12-09 DIAGNOSIS — Z789 Other specified health status: Secondary | ICD-10-CM

## 2016-12-09 DIAGNOSIS — F1010 Alcohol abuse, uncomplicated: Secondary | ICD-10-CM | POA: Insufficient documentation

## 2016-12-09 DIAGNOSIS — F1910 Other psychoactive substance abuse, uncomplicated: Secondary | ICD-10-CM | POA: Insufficient documentation

## 2016-12-09 NOTE — Progress Notes (Signed)
Patient is here for physical   Patient complains about left upper abdomen   Patient needs to be screen for liver & kidney

## 2016-12-09 NOTE — Patient Instructions (Signed)
Enfermedad heptica por alcoholismo (Alcoholic Liver Disease) El hgado es un rgano que transforma los alimentos en [place]erga, absorbe las vitaminas de los alimentos, elimina las toxinas de la sangre y Armed forces operational officerfabrica protenas. La enfermedad heptica por alcoholismo ocurre cuando el hgado se daa debido al consumo de alcohol y deja de funcionar correctamente. CAUSAS La causa de esta enfermedad es el consumo excesivo de alcohol durante varios aos. FACTORES DE RIESGO Es ms probable que esta afeccin se manifieste en:  Mujeres.  Las personas con antecedentes familiares de la enfermedad.  Las personas cuya alimentacin es deficiente.  Las [place] Tool Workspersonas obesas. SNTOMAS Los primeros sntomas de esta enfermedad incluyen lo siguiente:  Alma FriendlyFatiga.  Debilidad.  Molestias abdominales en la regin superior derecha. Los sntomas de enfermedad moderada incluyen lo siguiente:  [place] RutsFiebre.  Piel amarillenta, plida u oscura.  Dolor abdominal.  Nuseas y vmitos.  Prdida de peso.  Prdida del apetito. Los sntomas de enfermedad avanzada incluyen lo siguiente:  Hinchazn abdominal.  Hemorragias nasales o encas sangrantes.  Picazn en la piel.  Aumento del tamao de las puntas de los dedos [place](acropaquia).  Heces de color claro y con olor ftido (esteatorrea).  Cambios en el humor.  Sensacin de Designer, jewelleryagitacin.  Confusin.  Dificultad para concentrarse. Algunas personas no tienen sntomas hasta que la enfermedad es grave. A menudo, los sntomas se intensifican despus de tomar en exceso. DIAGNSTICO Esta enfermedad se diagnostica mediante:  Un examen fsico.  Anlisis de sangre.  Extraccin de Lauris Poaguna muestra de tejido heptico para examinarla con un microscopio (biopsia de hgado). Tambin pueden hacerle otros estudios, por ejemplo:  Radiografas.  Ecografa. TRATAMIENTO El tratamiento puede incluir lo siguiente:  Suspender el consumo de alcohol para permitir que el hgado se  recupere.  Unirse a un grupo de apoyo o Pharmacologistmantener reuniones con un consejero.  Medicamentos para reducir Futures traderla inflamacin. Tal vez se recomienden si la enfermedad es grave.  Un trasplante de hgado. Este es el nico tratamiento si la enfermedad es muy grave.  Terapia nutricional. Esto puede implicar lo siguiente: ? Tomar vitaminas. ? Consumir alimentos con alto contenido de tiamina, como cereales integrales, cerdo y vegetales crudos. ? Consumir alimentos con alto contenido de cido flico, como verduras, frutas, carnes, frijoles, frutos secos y productos lcteos. ? Consumir una dieta que incluya alimentos con alto contenido de carbohidratos, como yogur, frijoles, patatas y Surveyor, mineralsarroz. INSTRUCCIONES PARA EL CUIDADO EN EL HOGAR  No beba alcohol.  Tome los medicamentos solamente como se lo haya indicado el mdico.  Tome las vitaminas solamente como se lo haya indicado el mdico.  Siga las indicaciones del mdico en lo que respecta a la dieta. SOLICITE ATENCIN MDICA SI:  Lance Mussiene fiebre.  Le falta el aire o tiene dificultad para respirar.  Observa sangre de color rojo brillante en la materia fecal, o las heces son negras y de Geophysicist/field seismologistaspecto alquitranado.  Vomita sangre.  La piel se le torna de un color ms amarillento, plida u oscura.  Comienza a sentir dolores de Turkmenistancabeza.  Tiene dificultad para pensar.  Tiene problemas de equilibrio o para caminar. Esta informacin no tiene Theme park managercomo fin reemplazar el consejo del mdico. Asegrese de hacerle al mdico cualquier pregunta que tenga. Document Released: 09/02/2014 Document Revised: 09/02/2014 Document Reviewed: 03/20/2014 Elsevier Interactive Patient Education  [employer]2018 Elsevier Inc.

## 2016-12-09 NOTE — Progress Notes (Signed)
Subjective:   Patient ID: Jason Hunt, male    DOB: March 17, 1976, 41 y.o.   MRN: [id number]  Chief Complaint  Patient presents with  . Annual Exam   HPI Jason Hunt 41 y.o. male presents for a comprehensive physical exam.  Patient denies chest pain, chest pressure/discomfort, claudication, dyspnea, lower extremity edema, near-syncope, palpitations and syncope.  He denies any family history of HTN, DM, or Heart Disease. He expresses concerns about his health due to drinking history. He reports drinking 8 beers per day. He declines speaking with LCSW at this time. This has been associated with irritiation and burning of the esophagus. He denies abdominal bloating, hematemesis, melena and midespigastric pain. Medical therapy in the past has included none. History of herniated disc in February. Was seen by orthopedic specialist. He denies any paresthesias, back pain, or changes to bowel or bladder habits. Denies symptoms of all other pertinent systems.    No past medical history on file.  No past surgical history on file.  No family history on file.  Social History   Social History  . Marital status: Married    Spouse name: N/A  . Number of children: N/A  . Years of education: N/A   Occupational History  . Not on file.   Social History Main Topics  . Smoking status: Never Smoker  . Smokeless tobacco: Never Used  . Alcohol use Yes  . Drug use: No  . Sexual activity: Not on file   Other Topics Concern  . Not on file   Social History Narrative  . No narrative on file    Outpatient Medications Prior to Visit  Medication Sig Dispense Refill  . diclofenac (VOLTAREN) 75 MG EC tablet Take 1 tablet (75 mg total) by mouth 2 (two) times daily. Take with food. (Patient not taking: Reported on 12/09/2016) 40 tablet 0  . gabapentin (NEURONTIN) 300 MG capsule Take 1 capsule (300 mg total) by mouth at bedtime as needed. (Patient not taking: Reported on 12/09/2016) 30 capsule 0  .  methocarbamol (ROBAXIN) 500 MG tablet Take 1 tablet (500 mg total) by mouth every 8 (eight) hours as needed for muscle spasms. (Patient not taking: Reported on 12/09/2016) 30 tablet 0  . predniSONE (STERAPRED UNI-PAK 21 TAB) 10 MG (21) TBPK tablet Take by mouth daily. Take 6 tabs by mouth daily  for 2 days, then 5 tabs for 2 days, then 4 tabs for 2 days, then 3 tabs for 2 days, 2 tabs for 2 days, then 1 tab by mouth daily for 2 days (Patient not taking: Reported on 12/09/2016) 42 tablet 0   No facility-administered medications prior to visit.     No Known Allergies  Review of Systems  Constitutional: Negative.   HENT:       Burning in throat   Eyes: Negative.   Respiratory: Negative.   Cardiovascular: Negative.   Gastrointestinal: Negative.   Genitourinary: Negative.   Musculoskeletal: Negative.   Skin: Negative.   Neurological: Negative.   Endo/Heme/Allergies: Negative.   Psychiatric/Behavioral: Positive for substance abuse (alcoholism).      Objective:    Physical Exam  Constitutional: He is oriented to person, place, and time. He appears well-developed and well-nourished.  HENT:  Head: Normocephalic and atraumatic.  Right Ear: External ear normal.  Left Ear: External ear normal.  Nose: Nose normal.  Mouth/Throat: Oropharynx is clear and moist.  Eyes: Pupils are equal, round, and reactive to light. Conjunctivae and EOM are normal.  Neck:  Normal range of motion. Neck supple. No JVD present.  Cardiovascular: Normal rate, regular rhythm, normal heart sounds and intact distal pulses.   Pulmonary/Chest: Effort normal and breath sounds normal.  Abdominal: Soft. Bowel sounds are normal. There is no tenderness.  Musculoskeletal: Normal range of motion.  Lymphadenopathy:    He has no cervical adenopathy.  Neurological: He is alert and oriented to person, place, and time. He has normal reflexes.  Skin: Skin is warm and dry.  Psychiatric: He has a normal mood and affect.  Nursing  note and vitals reviewed.   BP 128/75 (BP Location: Left Arm, Patient Position: Sitting, Cuff Size: Normal)   Pulse 67   Temp 98.7 F (37.1 C) (Oral)   Resp 18   Ht 5' 7"  (1.702 m)   Wt 166 lb (75.3 kg)   SpO2 97%   BMI 26.00 kg/m  Wt Readings from Last 3 Encounters:  12/09/16 166 lb (75.3 kg)  08/26/16 169 lb 3.2 oz (76.7 kg)  06/24/16 170 lb (77.1 kg)     There is no immunization history on file for this patient.  Lab Results  Component Value Date   TSH 1.380 12/09/2016   Lab Results  Component Value Date   WBC 9.5 12/09/2016   HGB 15.2 12/09/2016   HCT 45.6 12/09/2016   MCV 99 (H) 12/09/2016   PLT 337 12/09/2016   Lab Results  Component Value Date   NA 144 12/09/2016   K 4.5 12/09/2016   CO2 24 12/09/2016   GLUCOSE 85 12/09/2016   BUN 8 12/09/2016   CREATININE 0.86 12/09/2016   BILITOT 0.6 12/09/2016   ALKPHOS 68 12/09/2016   AST 30 12/09/2016   ALT 37 12/09/2016   PROT 6.9 12/09/2016   ALBUMIN 4.9 12/09/2016   CALCIUM 9.8 12/09/2016   Lab Results  Component Value Date   CHOL 211 (H) 12/09/2016   Lab Results  Component Value Date   HDL 58 12/09/2016   Lab Results  Component Value Date   LDLCALC 116 (H) 12/09/2016   Lab Results  Component Value Date   TRIG 183 (H) 12/09/2016   Lab Results  Component Value Date   CHOLHDL 3.6 12/09/2016   Lab Results  Component Value Date   HGBA1C 4.7 (L) 12/09/2016       Assessment & Plan:   Problem List Items Addressed This Visit    None    Visit Diagnoses    Annual physical exam    -  Primary   Relevant Orders   CBC with Differential (Completed)   CMP14+EGFR (Completed)   Lipid Panel (Completed)   TSH (Completed)   Hemoglobin A1c (Completed)   Vitamin D, 25-hydroxy (Completed)   Alcohol consumption heavy       Provided with information for resources.   Esophagitis       Referral for endoscopy based on patient's history.    Relevant Orders   Ambulatory referral to Gastroenterology      Follow up: Return As needed.   Fredia Beets, FNP

## 2016-12-10 LAB — CMP14+EGFR
ALT: 37 IU/L (ref 0–44)
AST: 30 IU/L (ref 0–40)
Albumin/Globulin Ratio: 2.5 — ABNORMAL HIGH (ref 1.2–2.2)
Albumin: 4.9 g/dL (ref 3.5–5.5)
Alkaline Phosphatase: 68 IU/L (ref 39–117)
BUN/Creatinine Ratio: 9 (ref 9–20)
BUN: 8 mg/dL (ref 6–24)
Bilirubin Total: 0.6 mg/dL (ref 0.0–1.2)
CO2: 24 mmol/L (ref 20–29)
Calcium: 9.8 mg/dL (ref 8.7–10.2)
Chloride: 102 mmol/L (ref 96–106)
Creatinine, Ser: 0.86 mg/dL (ref 0.76–1.27)
GFR calc Af Amer: 125 mL/min/{1.73_m2} (ref >59)
GFR calc non Af Amer: 108 mL/min/{1.73_m2} (ref >59)
Globulin, Total: 2 g/dL (ref 1.5–4.5)
Glucose: 85 mg/dL (ref 65–99)
Potassium: 4.5 mmol/L (ref 3.5–5.2)
Sodium: 144 mmol/L (ref 134–144)
Total Protein: 6.9 g/dL (ref 6.0–8.5)

## 2016-12-10 LAB — HEMOGLOBIN A1C
Est. average glucose Bld gHb Est-mCnc: 88 mg/dL
Hgb A1c MFr Bld: 4.7 % — ABNORMAL LOW (ref 4.8–5.6)

## 2016-12-10 LAB — LIPID PANEL
Chol/HDL Ratio: 3.6 ratio (ref 0.0–5.0)
Cholesterol, Total: 211 mg/dL — ABNORMAL HIGH (ref 100–199)
HDL: 58 mg/dL (ref >39)
LDL Calculated: 116 mg/dL — ABNORMAL HIGH (ref 0–99)
Triglycerides: 183 mg/dL — ABNORMAL HIGH (ref 0–149)
VLDL Cholesterol Cal: 37 mg/dL (ref 5–40)

## 2016-12-10 LAB — TSH: TSH: 1.38 u[IU]/mL (ref 0.450–4.500)

## 2016-12-10 LAB — VITAMIN D 25 HYDROXY (VIT D DEFICIENCY, FRACTURES): Vit D, 25-Hydroxy: 30 ng/mL (ref 30.0–100.0)

## 2016-12-10 LAB — CBC WITH DIFFERENTIAL/PLATELET
BASOS ABS: 0.1 10*3/uL (ref 0.0–0.2)
Basos: 1 %
EOS (ABSOLUTE): 1 10*3/uL — AB (ref 0.0–0.4)
Eos: 10 %
Hematocrit: 45.6 % (ref 37.5–51.0)
Hemoglobin: 15.2 g/dL (ref 13.0–17.7)
IMMATURE GRANS (ABS): 0 10*3/uL (ref 0.0–0.1)
IMMATURE GRANULOCYTES: 0 %
LYMPHS: 22 %
Lymphocytes Absolute: 2.1 10*3/uL (ref 0.7–3.1)
MCH: 32.9 pg (ref 26.6–33.0)
MCHC: 33.3 g/dL (ref 31.5–35.7)
MCV: 99 fL — ABNORMAL HIGH (ref 79–97)
Monocytes Absolute: 0.9 10*3/uL (ref 0.1–0.9)
Monocytes: 10 %
NEUTROS PCT: 57 %
Neutrophils Absolute: 5.5 10*3/uL (ref 1.4–7.0)
PLATELETS: 337 10*3/uL (ref 150–379)
RBC: 4.62 x10E6/uL (ref 4.14–5.80)
RDW: 12.7 % (ref 12.3–15.4)
WBC: 9.5 10*3/uL (ref 3.4–10.8)

## 2016-12-16 ENCOUNTER — Other Ambulatory Visit: Payer: Self-pay | Admitting: Family Medicine

## 2016-12-16 DIAGNOSIS — E782 Mixed hyperlipidemia: Secondary | ICD-10-CM

## 2016-12-16 MED ORDER — ATORVASTATIN CALCIUM 10 MG PO TABS: 10.0000 mg | ORAL_TABLET | Freq: Every day | ORAL | 2 refills | Status: AC

## 2016-12-20 ENCOUNTER — Telehealth: Payer: Self-pay

## 2016-12-20 NOTE — Telephone Encounter (Signed)
-----   Message from Lizbeth Bark, [place] sent at 12/16/2016  3:40 PM EDT ----- Labs that evaluated your blood cells, fluid and electrolyte balance are normal. No signs of anemia, infection, or inflammation present. Kidney function normal Liver function normal Thyroid function normal Diabetes screening negative Cholesterol levels are high. This can increase your risk of heart disease overtime. You will be prescribed atorvastatin to help lower risk. Start eating a diet low in saturated fat. Limit your intake of fried foods, red meats, and whole milk. Increase physical activity.Recommend follow up in 3 months. Vitamin d level is normal.

## 2016-12-20 NOTE — Telephone Encounter (Signed)
CMA call regarding lab results  Patient did not answer & unable to leave message  

## 2016-12-26 ENCOUNTER — Encounter: Payer: Self-pay | Admitting: Physical Therapy

## 2016-12-26 ENCOUNTER — Ambulatory Visit: Payer: Self-pay | Attending: Surgery | Admitting: Physical Therapy

## 2016-12-26 DIAGNOSIS — M62830 Muscle spasm of back: Secondary | ICD-10-CM | POA: Insufficient documentation

## 2016-12-26 DIAGNOSIS — M545 Low back pain, unspecified: Secondary | ICD-10-CM

## 2016-12-26 NOTE — Therapy (Signed)
[facility] [address] [place], [place], [address] Phone: [phone]   Fax:  [phone]  Physical Therapy Evaluation  Patient Details  Name: Jason Hunt MRN: [id number] Date of Birth: [birth year] No Data Recorded  Encounter Date: 12/26/2016      PT End of Session - 12/26/16 1749    Visit Number 1   Date for PT Re-Evaluation 02/25/17   PT Start Time 1654   PT Stop Time 1750   PT Time Calculation (min) 56 min   Activity Tolerance Patient tolerated treatment well   Behavior During Therapy [facility] for tasks assessed/performed      History reviewed. No pertinent past medical history.  History reviewed. No pertinent surgical history.  There were no vitals filed for this visit.       Subjective Assessment - 12/26/16 1659    Subjective Patient reports low back pain since May 09, 2016.  He reports that day he was lifting a very heavy column, he reports that he did it, did not feel any pain, until the next morning he woke up and could not walk due to pain.  He went to the ED that day, x-rays were negative.  He went to the orthopedic center and an MRI showed a herniated disc.  He took prednisone without any relief.  He reports now that he has pain with working.   Limitations Lifting;House hold activities   Patient Stated Goals have less pain with work   Currently in Pain? Yes   Pain Score 1    Pain Location Back   Pain Orientation Lower   Pain Descriptors / Indicators Sharp;Stabbing   Pain Type Acute pain   Pain Onset More than a month ago   Pain Frequency Intermittent   Aggravating Factors  reports that he has to stop working after 6-7 hours, he was able to work 12-14 hours.  twisting, 5/10   Pain Relieving Factors rest helps, pain can be 0/10   Effect of Pain on Daily Activities limits his ability to work, at times difficulty walking, putting on shoes and lifting children            [facility] PT Assessment  - 12/26/16 0001      Assessment   Medical Diagnosis Back pain   Onset Date/Surgical Date 05/09/16   Prior Therapy no     Precautions   Precautions None     Balance Screen   Has the patient fallen in the past 6 months No   Has the patient had a decrease in activity level because of a fear of falling?  No   Is the patient reluctant to leave their home because of a fear of falling?  No     Home Environment   Additional Comments prior to this did his own yardwork and housework, has a 40 year old     Prior Function   Level of Independence Independent   Vocation Full time employment   Vocation Requirements lift 100#, climb ladders, carry   Leisure no exercise     Posture/Postural Control   Posture Comments decreased lordosis     ROM / Strength   AROM / PROM / Strength AROM;Strength     AROM   Overall AROM Comments lumbar ROM decreased 25% no increase of pain     Strength   Overall Strength Comments WNL's no increase of pain     Flexibility   Soft Tissue Assessment /Muscle Length yes  Hamstrings tight   Quadriceps very tight with pain in the back   Piriformis very tight with pain in the back     Palpation   Palpation comment very tight but no tenderness in the lumbar area            Objective measurements completed on examination: See above findings.                  PT Education - 12/26/16 1748    Education provided Yes   Education Details gave Extension exercises, and LE flexibility   Person(s) Educated Patient   Methods Explanation;Demonstration;Tactile cues;Verbal cues;Handout   Comprehension Verbalized understanding;Returned demonstration             PT Long Term Goals - 12/26/16 1753      PT LONG TERM GOAL #1   Title independent with HEP   Time 1   Period Weeks   Status Achieved                Plan - 12/26/16 1750    Clinical Impression Statement Patient with pain starting after lifting somehting heavy in January, he  reports an MRI showed a herniated disc.  He reports that he is overall doing much better but has not returned to his full hours of work and reports that repetitive lifting hurts, he is veyr tight in the LE's, he did not seem to want to see [place] again and wanted to try exercises at home   Clinical Presentation Stable   Clinical Decision Making Low   Rehab Potential Good   PT Frequency 1x / week   PT Duration 8 weeks   PT Treatment/Interventions ADLs/Self Care Home Management;Electrical Stimulation;Moist Heat;Traction;Therapeutic activities;Therapeutic exercise;Neuromuscular re-education;Patient/family education   PT Next Visit Plan patient reports that he will try exercises on own at home, we will place on hold and D/C in two weeks if he does not call   Consulted and Agree with Plan of Care Patient      Patient will benefit from skilled therapeutic intervention in order to improve the following deficits and impairments:  Decreased activity tolerance, Decreased range of motion, Increased muscle spasms, Pain  Visit Diagnosis: Acute bilateral low back pain without sciatica - Plan: PT plan of care cert/re-cert  Muscle spasm of back - Plan: PT plan of care cert/re-cert     Problem List There are no active problems to display for this patient.   Jearld Lesch PT 12/26/2016, 5:56 PM  [facility] [address] [place], [place], [address] Phone: [phone]   Fax:  [phone]  Name: Jason Hunt MRN: [id number] Date of Birth: 1975-11-03

## 2016-12-29 ENCOUNTER — Encounter: Payer: Self-pay | Admitting: Gastroenterology

## 2016-12-30 ENCOUNTER — Telehealth (INDEPENDENT_AMBULATORY_CARE_PROVIDER_SITE_OTHER): Payer: Self-pay | Admitting: Family Medicine

## 2016-12-30 ENCOUNTER — Other Ambulatory Visit: Payer: Self-pay | Admitting: Family Medicine

## 2016-12-30 ENCOUNTER — Telehealth: Payer: Self-pay | Admitting: Family Medicine

## 2016-12-30 DIAGNOSIS — Z9189 Other specified personal risk factors, not elsewhere classified: Secondary | ICD-10-CM

## 2016-12-30 NOTE — Telephone Encounter (Signed)
Please ask patient what referral is for ( dental caries, etc.) so referral can be placed. Please make patient aware of  awaiting list regarding referrals.

## 2016-12-30 NOTE — Telephone Encounter (Signed)
Patient called requesting a dental referral for clinic  Thank you

## 2016-12-30 NOTE — Telephone Encounter (Signed)
Gm Patient called requesting a dental referral for clinic  Thank you

## 2016-12-30 NOTE — Telephone Encounter (Signed)
CMA call regarding referral to dentist   Patient was aware and understood

## 2016-12-30 NOTE — Telephone Encounter (Signed)
CMA call regarding lab results   Patient verify DOB  Patient was aware and understood  

## 2016-12-30 NOTE — Telephone Encounter (Signed)
Pt called requesting lab results , please f/up °

## 2016-12-30 NOTE — Telephone Encounter (Signed)
Referral placed.

## 2017-02-23 ENCOUNTER — Ambulatory Visit: Payer: Self-pay | Admitting: Gastroenterology

## 2017-02-23 ENCOUNTER — Other Ambulatory Visit: Payer: Self-pay

## 2017-02-27 ENCOUNTER — Encounter: Payer: Self-pay | Admitting: Family Medicine

## 2019-09-02 ENCOUNTER — Ambulatory Visit: Payer: Self-pay | Admitting: Internal Medicine

## 2019-09-09 ENCOUNTER — Encounter: Payer: Self-pay | Admitting: Family Medicine

## 2019-09-09 ENCOUNTER — Ambulatory Visit: Payer: Self-pay | Attending: Family Medicine | Admitting: Family Medicine

## 2019-09-09 ENCOUNTER — Other Ambulatory Visit: Payer: Self-pay

## 2019-09-09 VITALS — BP 139/85 | HR 62 | Temp 97.9°F | Ht 67.0 in | Wt 174.0 lb

## 2019-09-09 DIAGNOSIS — R1012 Left upper quadrant pain: Secondary | ICD-10-CM

## 2019-09-09 DIAGNOSIS — Z603 Acculturation difficulty: Secondary | ICD-10-CM

## 2019-09-09 DIAGNOSIS — M5416 Radiculopathy, lumbar region: Secondary | ICD-10-CM | POA: Insufficient documentation

## 2019-09-09 DIAGNOSIS — Z789 Other specified health status: Secondary | ICD-10-CM

## 2019-09-09 DIAGNOSIS — Z758 Other problems related to medical facilities and other health care: Secondary | ICD-10-CM

## 2019-09-09 DIAGNOSIS — R1032 Left lower quadrant pain: Secondary | ICD-10-CM

## 2019-09-09 DIAGNOSIS — E785 Hyperlipidemia, unspecified: Secondary | ICD-10-CM | POA: Insufficient documentation

## 2019-09-09 MED ORDER — OMEPRAZOLE 40 MG PO CPDR: 40.0000 mg | DELAYED_RELEASE_CAPSULE | Freq: Every day | ORAL | 3 refills | Status: AC

## 2019-09-09 NOTE — Progress Notes (Signed)
Reestablish care and stomach pain under rib for the last 2 months.

## 2019-09-09 NOTE — Progress Notes (Signed)
Subjective:  Patient ID: Jason Hunt, male    DOB: 1975-12-26  Age: 44 y.o. MRN: [id number]  CC: Recurrent abdominal pain-Georgio Hattabaugh MD  Due to language barrier, [employer] video interpretation system was used at today's visit  HPI Jason Hunt, 44 yo Hispanic male who was last seen in the office on 12/09/2016, who presents secondary to the complaint of 2 months of recurrent left-sided abdominal pain.  When pain occurs, it lasts for about 15 minutes.  There is nothing that patient has discovered that he can do to make the pain any better or any worse or to cause the pain to go away sooner.  Pain is not related to his level of activity.  Patient does have a history of lumbar radiculopathy and states that he sometimes has increased back pain at the end of a strenuous workday.  He also reports a history of being told in the past that he had a left inguinal hernia but did not have any surgical repair.  He does not report any pain in this area with lifting heavy objects or with cough.  He is abdominal pain is generally in the left upper part of his abdomen beneath the ribs or in the left lower part of his abdomen.  He has wondered if the abdominal pain could be related to his alcohol use.  He reports that he drinks 2-3 beers per night and on the weekend drinks more.  He denies any nausea/vomiting/diarrhea or constipation.  He denies any reflux symptoms such as burping/belching or backwash of bad tasting fluid into the mouth or throat.  He denies any blood in the stool or black stools.  He denies any dysuria.  He does have to get up several times at night to urinate but states that since he works in [employer] he drinks a lot of water throughout the day.  He does not have any weakening of the urinary stream or difficulty urinating.  On review of systems, he denies any chest pain or palpitations, no shortness of breath or cough, no headaches or dizziness and no increased thirst or blurred  vision.  Past Medical History:  Diagnosis Date  . Lumbar radiculopathy     Past Surgical History:  Procedure Laterality Date  . no past surgery      Family History  Problem Relation Age of Onset  . Diabetes Neg Hx   . Hypertension Neg Hx   . Heart disease Neg Hx   . Cancer Neg Hx     Social History   Tobacco Use  . Smoking status: Never Smoker  . Smokeless tobacco: Never Used  Substance Use Topics  . Alcohol use: Yes    ROS Review of Systems  Constitutional: Positive for fatigue (Occasional, mild). Negative for chills and fever.  HENT: Negative for sore throat and trouble swallowing.   Eyes: Negative for photophobia and visual disturbance.  Respiratory: Negative for cough and shortness of breath.   Cardiovascular: Negative for chest pain, palpitations and leg swelling.  Gastrointestinal: Positive for abdominal pain. Negative for abdominal distention, anal bleeding, blood in stool, constipation, diarrhea and nausea.  Endocrine: Negative for cold intolerance, heat intolerance, polydipsia, polyphagia and polyuria.  Genitourinary: Negative for dysuria, flank pain, frequency and hematuria.       He reports that he drinks a lot of water throughout the day and does have to get up at night to urinate  Musculoskeletal: Positive for back pain (History of herniated lumbar disc). Negative for  arthralgias and gait problem.  Skin: Negative for rash.  Neurological: Negative for dizziness and headaches.  Hematological: Negative for adenopathy. Does not bruise/bleed easily.  Psychiatric/Behavioral: Negative for self-injury and suicidal ideas.    Objective:   Today's Vitals: BP 139/85   Pulse 62   Temp 97.9 F (36.6 C) (Temporal)   Ht 5\' 7"  (1.702 m)   Wt 174 lb (78.9 kg)   SpO2 97%   BMI 27.25 kg/m   Physical Exam Vitals and nursing note reviewed.  Constitutional:      General: He is not in acute distress.    Appearance: Normal appearance. He is not ill-appearing.      Comments: Well-nourished well-developed male who appears slightly younger than stated age who is in no acute distress and wearing a mask as per office COVID-19 protocol  Cardiovascular:     Rate and Rhythm: Normal rate and regular rhythm.  Pulmonary:     Effort: Pulmonary effort is normal.     Breath sounds: Normal breath sounds.  Abdominal:     General: There is no distension.     Palpations: Abdomen is soft.     Tenderness: There is abdominal tenderness (Patient with medial left upper quadrant discomfort to palpation and left lower quadrant discomfort with palpation). There is no right CVA tenderness, left CVA tenderness, guarding or rebound.     Hernia: A hernia (Mild bulge but no pain with palpation of the left inguinal area and having patient cough) is present.  Musculoskeletal:        General: No tenderness.     Cervical back: Normal range of motion and neck supple. No tenderness.     Right lower leg: No edema.     Left lower leg: No edema.  Lymphadenopathy:     Cervical: No cervical adenopathy.  Skin:    General: Skin is warm and dry.     Findings: No rash.  Neurological:     General: No focal deficit present.     Mental Status: He is alert and oriented to person, place, and time.  Psychiatric:        Mood and Affect: Mood normal.        Behavior: Behavior normal.     Assessment & Plan:  1. Left upper quadrant pain Patient with complaint of left lower quadrant pain and on exam, his abdominal pain is near the epigastric area.  We will have patient start omeprazole 40 mg daily and avoid known spicy/greasy foods.  Will check CBC to look for blood loss or elevated white blood cell count indicative of infection and also check comprehensive metabolic panel as patient also reports that he drinks beer on a daily basis. - CBC - Comprehensive metabolic panel - omeprazole (PRILOSEC) 40 MG capsule; Take 1 capsule (40 mg total) by mouth daily.  Dispense: 30 capsule; Refill: 3  2. Left  lower quadrant pain Patient with left lower quadrant pain and he also reports a history of inguinal hernia.  Patient will be scheduled for ultrasound to look for presence of inguinal hernia.  He will also have a CBC to look for ED blood loss or elevated white blood cell count suggestive of infection.  If he has any worsening of abdominal pain or any other concerns, he should seek follow-up at the emergency department. - Pel[facility]; Future - CBC - Comprehensive metabolic panel  3. Hyperlipidemia, unspecified hyperlipidemia type He has a history of hyperlipidemia and has been on atorvastatin in the  past but is not currently taking this medication.  He will have repeat lipid panel at today's visit and will be notified if he needs to resume use of medication. -lipid panel  4. Language barrier Spanish-speaking interpreter obtained through [employer] video interpretation systems to help with language barrier  Outpatient Encounter Medications as of 09/09/2019  Medication Sig  . atorvastatin (LIPITOR) 10 MG tablet Take 1 tablet (10 mg total) by mouth daily. (Patient not taking: Reported on 09/09/2019)  . omeprazole (PRILOSEC) 40 MG capsule Take 1 capsule (40 mg total) by mouth daily.   No facility-administered encounter medications on file as of 09/09/2019.    An After Visit Summary was printed and given to the patient.   Follow-up: Return in about 3 weeks (around 09/30/2019) for abdominal pain; sooner if needed.    Antony Blackbird MD

## 2019-09-10 LAB — COMPREHENSIVE METABOLIC PANEL WITH GFR
ALT: 33 IU/L (ref 0–44)
AST: 23 IU/L (ref 0–40)
Albumin/Globulin Ratio: 1.9 (ref 1.2–2.2)
Albumin: 4.5 g/dL (ref 4.0–5.0)
Alkaline Phosphatase: 86 IU/L (ref 39–117)
BUN/Creatinine Ratio: 14 (ref 9–20)
BUN: 12 mg/dL (ref 6–24)
Bilirubin Total: 0.6 mg/dL (ref 0.0–1.2)
CO2: 23 mmol/L (ref 20–29)
Calcium: 9.6 mg/dL (ref 8.7–10.2)
Chloride: 104 mmol/L (ref 96–106)
Creatinine, Ser: 0.83 mg/dL (ref 0.76–1.27)
GFR calc Af Amer: 125 mL/min/1.73
GFR calc non Af Amer: 108 mL/min/1.73
Globulin, Total: 2.4 g/dL (ref 1.5–4.5)
Glucose: 95 mg/dL (ref 65–99)
Potassium: 4.6 mmol/L (ref 3.5–5.2)
Sodium: 142 mmol/L (ref 134–144)
Total Protein: 6.9 g/dL (ref 6.0–8.5)

## 2019-09-10 LAB — CBC
Hematocrit: 47.3 % (ref 37.5–51.0)
Hemoglobin: 15.8 g/dL (ref 13.0–17.7)
MCH: 32.5 pg (ref 26.6–33.0)
MCHC: 33.4 g/dL (ref 31.5–35.7)
MCV: 97 fL (ref 79–97)
Platelets: 354 x10E3/uL (ref 150–450)
RBC: 4.86 x10E6/uL (ref 4.14–5.80)
RDW: 11.6 % (ref 11.6–15.4)
WBC: 7 x10E3/uL (ref 3.4–10.8)

## 2019-09-16 ENCOUNTER — Other Ambulatory Visit: Payer: Self-pay

## 2019-09-16 ENCOUNTER — Ambulatory Visit (HOSPITAL_COMMUNITY)
Admission: RE | Admit: 2019-09-16 | Discharge: 2019-09-16 | Disposition: A | Discharge status: Home / Self Care | Payer: Self-pay | Source: Ambulatory Visit | Attending: Family Medicine | Admitting: Family Medicine

## 2019-09-16 DIAGNOSIS — R1032 Left lower quadrant pain: Secondary | ICD-10-CM | POA: Insufficient documentation

## 2021-08-13 ENCOUNTER — Ambulatory Visit: Payer: Self-pay | Admitting: *Deleted

## 2021-08-13 NOTE — Telephone Encounter (Addendum)
?  Chief Complaint: lower abd pain under belly button and frequent urination, not feeling like bladder is empty after urinating. ?Symptoms: abd pain under belly button, frequent urination ?Frequency: For 2 days ?Pertinent Negatives: Patient denies blood in urine ?Disposition: [] ED /[x] Urgent Care (no appt availability in office) / [] Appointment(In office/virtual)/ []  [facility]/ [] Home Care/ [] Refused Recommended Disposition /[] [facility]/ []  Follow-up with PCP ?Additional Notes: Offered a [place] Virtual Visit option however he decided he would rather go to the hospital.    ? ?He called in with Spanish interpreter [phone] ?

## 2021-08-13 NOTE — Telephone Encounter (Signed)
Reason for Disposition ? Urinating more frequently than usual (i.e., frequency) ? ?Answer Assessment - Initial Assessment Questions ?1. SYMPTOM: "What's the main symptom you're concerned about?" (e.g., frequency, incontinence) ?    I'm having pain under my belly button for 2 days and I feel it when I go to the bathroom.  When I press it hurts.  I'm going more than usual and when I do go I don't feel like I finish urinating before I need to go again. ?2. ONSET: "When did the  Pain  start?" ?    2 days ?3. PAIN: "Is there any pain?" If Yes, ask: "How bad is it?" (Scale: 1-10; mild, moderate, severe) ?    Yes under belly button ?4. CAUSE: "What do you think is causing the symptoms?" ?     ?5. OTHER SYMPTOMS: "Do you have any other symptoms?" (e.g., fever, flank pain, blood in urine, pain with urination) ?    Does not feel like he is emptying his bladder.    No flank pain. ?6. PREGNANCY: "Is there any chance you are pregnant?" "When was your last menstrual period?" ?    N/A ? ?Protocols used: Urinary Symptoms-A-AH ? ?

## 2021-11-01 ENCOUNTER — Emergency Department (HOSPITAL_COMMUNITY): Payer: Self-pay

## 2021-11-01 ENCOUNTER — Other Ambulatory Visit: Payer: Self-pay

## 2021-11-01 ENCOUNTER — Emergency Department (HOSPITAL_COMMUNITY)
Admission: EM | Admit: 2021-11-01 | Discharge: 2021-11-01 | Disposition: A | Discharge status: Home / Self Care | Payer: Self-pay | Attending: Emergency Medicine | Admitting: Emergency Medicine

## 2021-11-01 DIAGNOSIS — S301XXA Contusion of abdominal wall, initial encounter: Secondary | ICD-10-CM | POA: Insufficient documentation

## 2021-11-01 DIAGNOSIS — Y990 Civilian activity done for income or pay: Secondary | ICD-10-CM | POA: Insufficient documentation

## 2021-11-01 DIAGNOSIS — W2209XA Striking against other stationary object, initial encounter: Secondary | ICD-10-CM | POA: Insufficient documentation

## 2021-11-01 LAB — URINALYSIS, ROUTINE W REFLEX MICROSCOPIC
Bilirubin Urine: NEGATIVE
Glucose, UA: NEGATIVE mg/dL
Hgb urine dipstick: NEGATIVE
Ketones, ur: NEGATIVE mg/dL
Leukocytes,Ua: NEGATIVE
Nitrite: NEGATIVE
Protein, ur: NEGATIVE mg/dL
Specific Gravity, Urine: 1.01 (ref 1.005–1.030)
pH: 5 (ref 5.0–8.0)

## 2021-11-01 LAB — COMPREHENSIVE METABOLIC PANEL
ALT: 46 U/L — ABNORMAL HIGH (ref 0–44)
AST: 30 U/L (ref 15–41)
Albumin: 4 g/dL (ref 3.5–5.0)
Alkaline Phosphatase: 73 U/L (ref 38–126)
Anion gap: 8 (ref 5–15)
BUN: 10 mg/dL (ref 6–20)
CO2: 27 mmol/L (ref 22–32)
Calcium: 9.2 mg/dL (ref 8.9–10.3)
Chloride: 103 mmol/L (ref 98–111)
Creatinine, Ser: 0.96 mg/dL (ref 0.61–1.24)
GFR, Estimated: >60 mL/min (ref >60)
Glucose, Bld: 102 mg/dL — ABNORMAL HIGH (ref 70–99)
Potassium: 3.5 mmol/L (ref 3.5–5.1)
Sodium: 138 mmol/L (ref 135–145)
Total Bilirubin: 1.2 mg/dL (ref 0.3–1.2)
Total Protein: 7.2 g/dL (ref 6.5–8.1)

## 2021-11-01 LAB — I-STAT CHEM 8, ED
BUN: 13 mg/dL (ref 6–20)
Calcium, Ion: 1.07 mmol/L — ABNORMAL LOW (ref 1.15–1.40)
Chloride: 101 mmol/L (ref 98–111)
Creatinine, Ser: 0.8 mg/dL (ref 0.61–1.24)
Glucose, Bld: 95 mg/dL (ref 70–99)
HCT: 47 % (ref 39.0–52.0)
Hemoglobin: 16 g/dL (ref 13.0–17.0)
Potassium: 4.2 mmol/L (ref 3.5–5.1)
Sodium: 137 mmol/L (ref 135–145)
TCO2: 26 mmol/L (ref 22–32)

## 2021-11-01 LAB — CBC
HCT: 47.5 % (ref 39.0–52.0)
Hemoglobin: 16.2 g/dL (ref 13.0–17.0)
MCH: 32.7 pg (ref 26.0–34.0)
MCHC: 34.1 g/dL (ref 30.0–36.0)
MCV: 95.8 fL (ref 80.0–100.0)
Platelets: 377 10*3/uL (ref 150–400)
RBC: 4.96 MIL/uL (ref 4.22–5.81)
RDW: 11.8 % (ref 11.5–15.5)
WBC: 7.8 10*3/uL (ref 4.0–10.5)
nRBC: 0 % (ref 0.0–0.2)

## 2021-11-01 LAB — LIPASE, BLOOD: Lipase: 29 U/L (ref 11–51)

## 2021-11-01 MED ORDER — IOHEXOL 300 MG/ML  SOLN
100.0000 mL | Freq: Once | INTRAMUSCULAR | Status: AC | PRN
  Administered 2021-11-01: 100 mL via INTRAVENOUS

## 2021-11-01 MED ORDER — SODIUM CHLORIDE 0.9 % IV BOLUS
1000.0000 mL | Freq: Once | INTRAVENOUS | Status: AC
  Administered 2021-11-01: 1000 mL via INTRAVENOUS

## 2021-11-01 MED ORDER — IBUPROFEN 800 MG PO TABS: 800.0000 mg | ORAL_TABLET | Freq: Three times a day (TID) | ORAL | 1 refills | Status: AC | PRN

## 2021-11-01 NOTE — ED Provider Notes (Signed)
[facility] EMERGENCY DEPARTMENT Provider Note   CSN: [id number] Arrival date & time: 11/01/21  1006     History {Add pertinent medical, surgical, social history, OB history to HPI:1} Chief Complaint  Patient presents with   Abdominal Pain    Anyelo Hunt is a 46 y.o. male.  Patient has a history of elevated lipids.  He was at work and he got hit in his right upper quadrant.  Patient complains of pain there   Abdominal Pain      Home Medications Prior to Admission medications   Medication Sig Start Date End Date Taking? Authorizing Provider  ibuprofen (ADVIL) 800 MG tablet Take 1 tablet (800 mg total) by mouth every 8 (eight) hours as needed for moderate pain. 11/01/21  Yes Bethann Berkshire, MD  atorvastatin (LIPITOR) 10 MG tablet Take 1 tablet (10 mg total) by mouth daily. Patient not taking: Reported on 09/09/2019 12/16/16   Lizbeth Bark, FNP  omeprazole (PRILOSEC) 40 MG capsule Take 1 capsule (40 mg total) by mouth daily. 09/09/19   Cain Saupe, MD      Allergies    Patient has no known allergies.    Review of Systems   Review of Systems  Gastrointestinal:  Positive for abdominal pain.    Physical Exam Updated Vital Signs BP (!) 137/91   Pulse 67   Temp 98 F (36.7 C) (Oral)   Resp 15   SpO2 97%  Physical Exam  ED Results / Procedures / Treatments   Labs (all labs ordered are listed, but only abnormal results are displayed) Labs Reviewed  COMPREHENSIVE METABOLIC PANEL - Abnormal; Notable for the following components:      Result Value   Glucose, Bld 102 (*)    ALT 46 (*)    All other components within normal limits  I-STAT CHEM 8, ED - Abnormal; Notable for the following components:   Calcium, Ion 1.07 (*)    All other components within normal limits  LIPASE, BLOOD  CBC  URINALYSIS, ROUTINE W REFLEX MICROSCOPIC  I-STAT CHEM 8, ED    EKG None  Radiology CT ABDOMEN PELVIS W CONTRAST  Result Date: 11/01/2021 CLINICAL  DATA:  Blunt abdominal trauma. Abdomen and right ribcage after being injured at work 5 days ago. EXAM: CT ABDOMEN AND PELVIS WITH CONTRAST TECHNIQUE: Multidetector CT imaging of the abdomen and pelvis was performed using the standard protocol following bolus administration of intravenous contrast. RADIATION DOSE REDUCTION: This exam was performed according to the departmental dose-optimization program which includes automated exposure control, adjustment of the mA and/or kV according to patient size and/or use of iterative reconstruction technique. CONTRAST:  OMNIPAQUE IOHEXOL 300 MG/ML  SOLN COMPARISON:  None FINDINGS: Lower chest: Lung bases are clear. No pleural or pericardial fluid. No evidence of lower right rib fracture. Hepatobiliary: Normal Pancreas: Normal Spleen: Normal Adrenals/Urinary Tract: Adrenal glands are normal. Kidneys are normal. No cyst, mass, stone or hydronephrosis. Bladder is normal. Stomach/Bowel: Stomach and small intestine are normal. No obstruction, inflammation or traumatic finding. Vascular/Lymphatic: Aorta and IVC are normal.  No adenopathy. Reproductive: Normal Other: No free fluid or air. Musculoskeletal: No evidence of regional fracture or body wall hemorrhage. IMPRESSION: Negative examination. No traumatic finding in the lower chest or the abdomen. No abnormality seen to explain the clinical presentation. Electronically Signed   By: Paulina Fusi M.D.   On: 11/01/2021 11:59    Procedures Procedures  {Document cardiac monitor, telemetry assessment procedure when appropriate:1}  Medications Ordered in ED Medications  sodium chloride 0.9 % bolus 1,000 mL (0 mLs Intravenous Stopped 11/01/21 1245)  iohexol (OMNIPAQUE) 300 MG/ML solution 100 mL (100 mLs Intravenous Contrast Given 11/01/21 1154)    ED Course/ Medical Decision Making/ A&P                           Medical Decision Making Amount and/or Complexity of Data Reviewed Labs: ordered. Radiology:  ordered.  Risk Prescription drug management.   Patient with contusion to abdomen.  He is given Motrin will follow-up as needed  {Document critical care time when appropriate:1} {Document review of labs and clinical decision tools ie heart score, Chads2Vasc2 etc:1}  {Document your independent review of radiology images, and any outside records:1} {Document your discussion with family members, caretakers, and with consultants:1} {Document social determinants of health affecting pt's care:1} {Document your decision making why or why not admission, treatments were needed:1} Final Clinical Impression(s) / ED Diagnoses Final diagnoses:  Contusion of abdominal wall, initial encounter    Rx / DC Orders ED Discharge Orders          Ordered    ibuprofen (ADVIL) 800 MG tablet  Every 8 hours PRN        11/01/21 1248

## 2021-11-01 NOTE — ED Triage Notes (Signed)
Pt here with abd pain and R ribcage pain after getting hit in the stomach with a table at work on wednesday. Pt states now the pain is worse and his abd feels hard to the touch.

## 2021-11-01 NOTE — Discharge Instructions (Signed)
Follow-up if not improving.  Take Motrin for pain if Tylenol does not help

## 2022-01-08 IMAGING — US US PELVIS LIMITED
1 series · 14 of 19 positions shown · non-contrast
Comparison: None available.

CLINICAL DATA: Initial evaluation for left lower quadrant pain.

EXAM:
LIMITED ULTRASOUND OF PELVIS
TECHNIQUE: Limited transabdominal ultrasound examination of the pelvis was
performed.

[Series 1: us pelvis limited · 19 acquisitions, 14 frames shown]
[im 1/19]
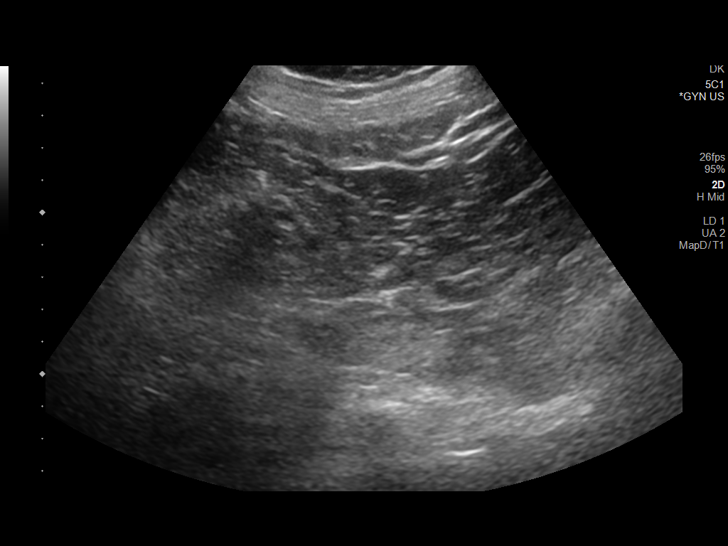
[im 3/19]
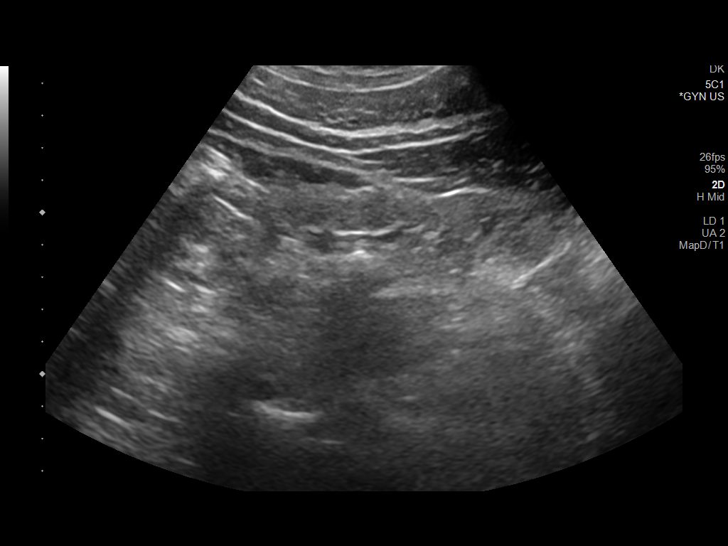
[im 4/19]
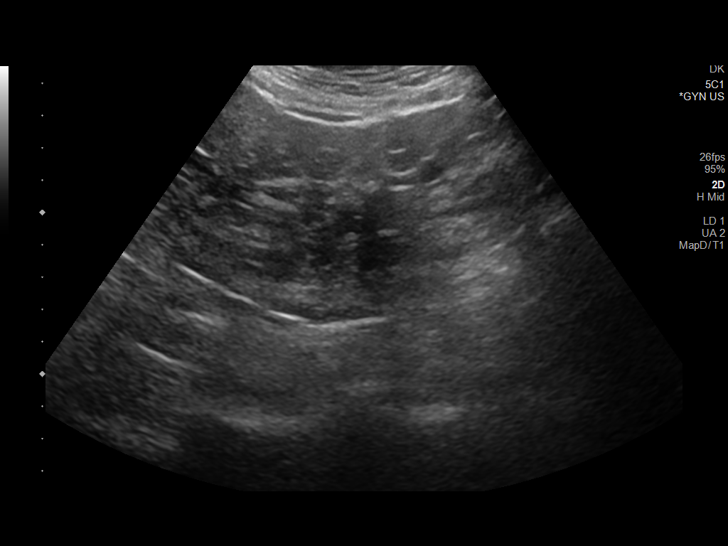
[im 5/19]
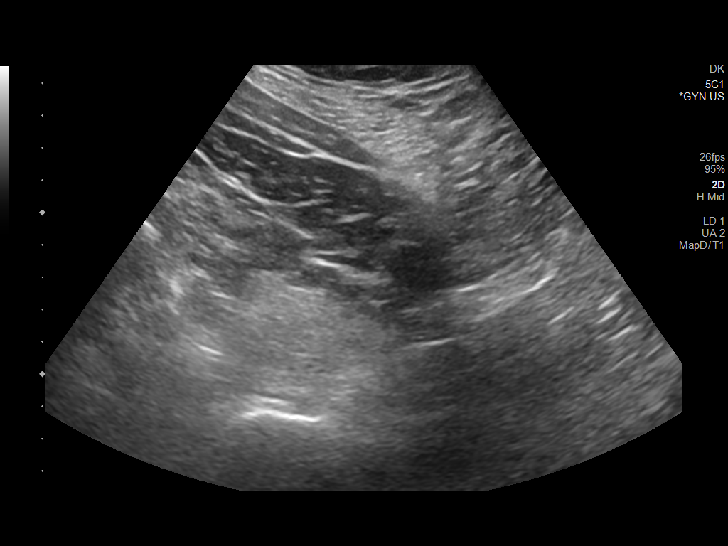
[im 7/19]
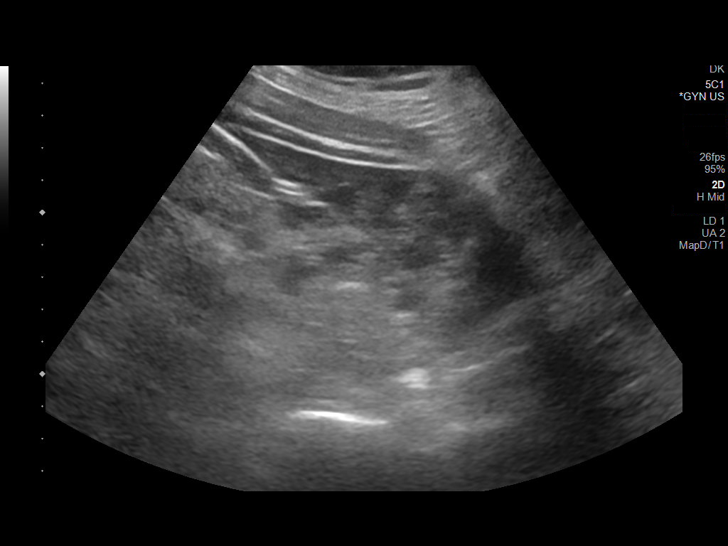
[im 8/19]
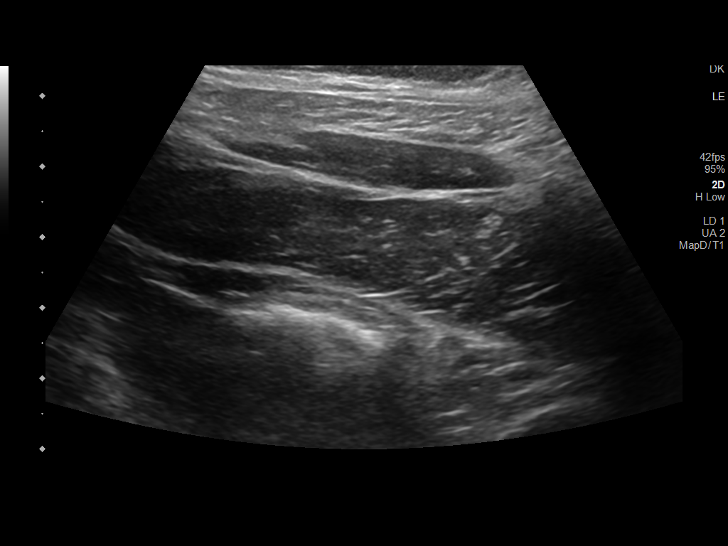
[im 9/19]
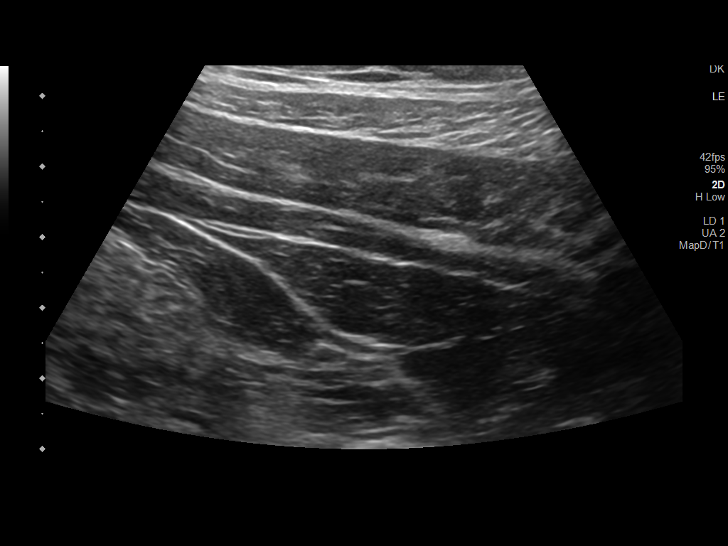
[im 11/19]
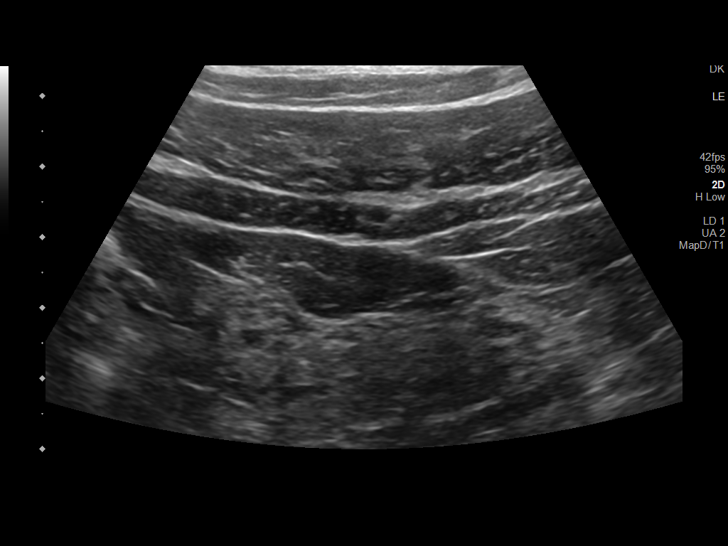
[im 12/19]
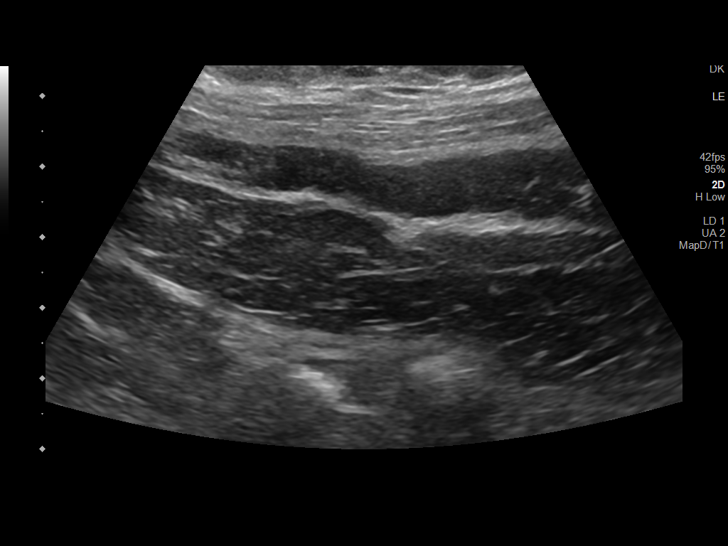
[im 13/19]
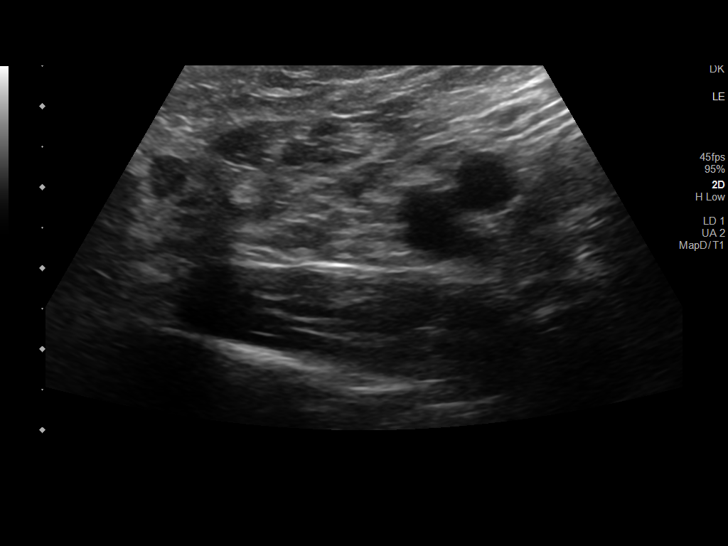
[im 15/19]
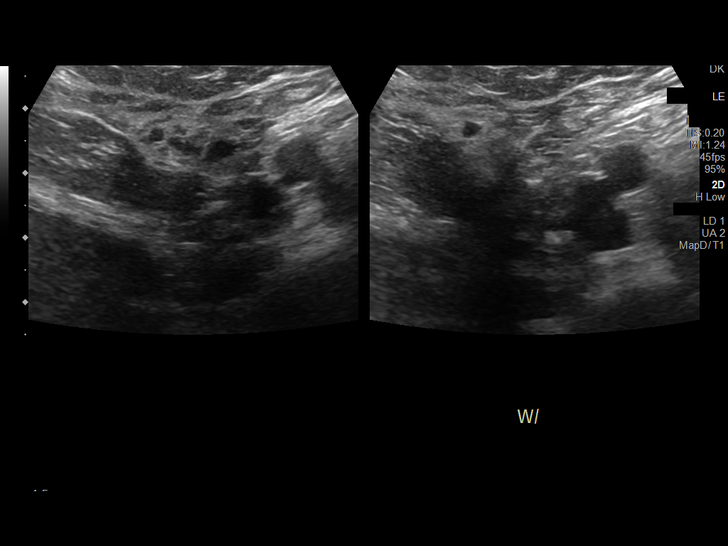
[im 16/19]
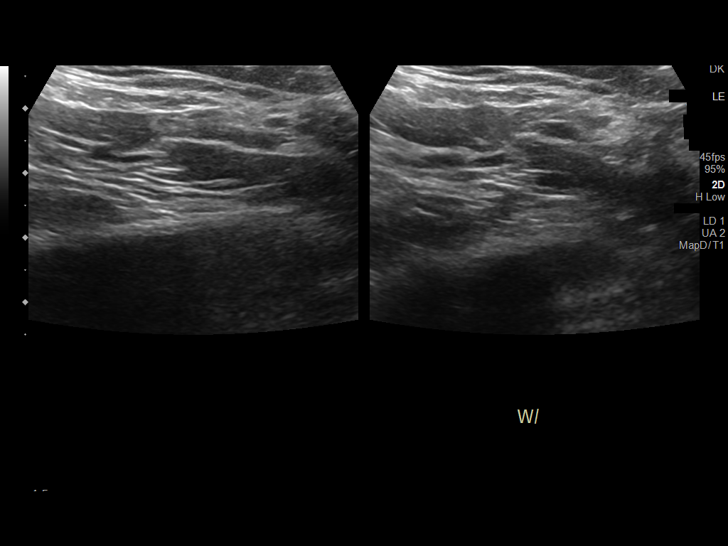
[im 17/19]
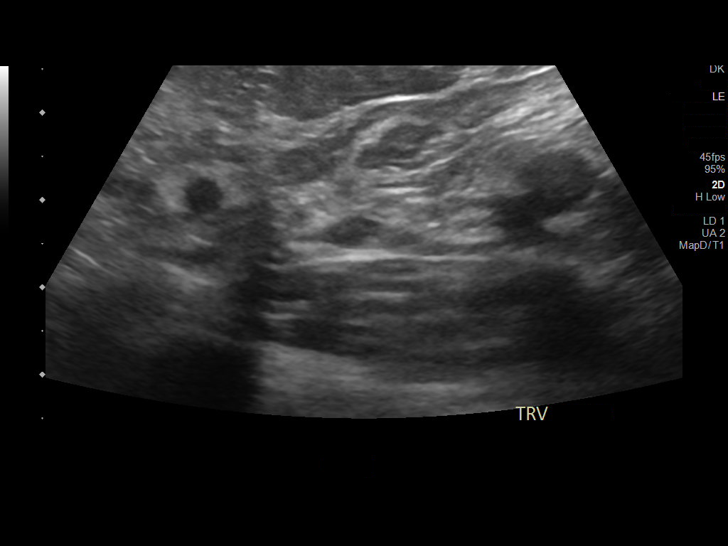
[im 19/19]
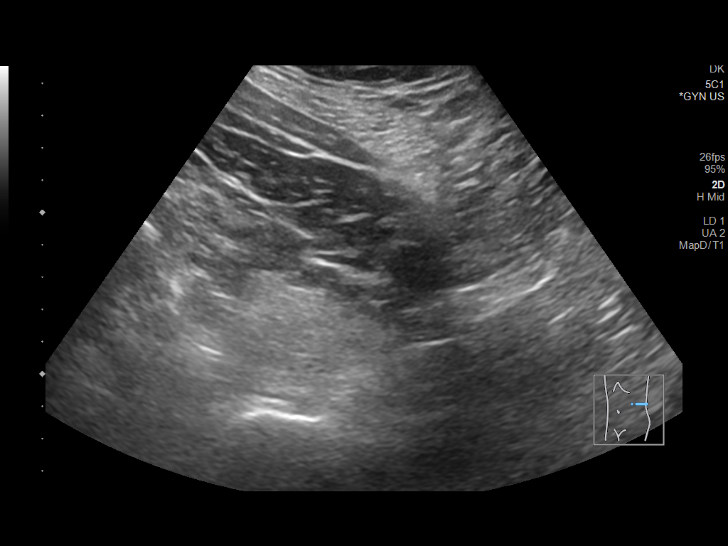

[14 of 19 positions shown; findings below may reference images not displayed]

FINDINGS: Targeted ultrasound of an area of concern at the left flank and
pelvis extending into the left groin was performed. Ultrasound
demonstrates no visible inguinal hernia. No soft tissue mass,
loculated collection, or other structural abnormality identified. No
adenopathy.
IMPRESSION: Negative pelvic ultrasound. No evidence for hernia or other
abnormality.
